# Patient Record
Sex: Male | Born: 1975 | Race: White | Hispanic: No | Marital: Married | Smoking: Never smoker
Health system: Southern US, Community
[De-identification: ages and names within clinical notes are randomized; demographics above are authoritative.]

## PROBLEM LIST (undated history)

## (undated) DIAGNOSIS — R972 Elevated prostate specific antigen [PSA]: Secondary | ICD-10-CM

## (undated) DIAGNOSIS — M5030 Other cervical disc degeneration, unspecified cervical region: Secondary | ICD-10-CM

## (undated) DIAGNOSIS — E663 Overweight: Secondary | ICD-10-CM

## (undated) DIAGNOSIS — G5692 Unspecified mononeuropathy of left upper limb: Secondary | ICD-10-CM

## (undated) DIAGNOSIS — E785 Hyperlipidemia, unspecified: Secondary | ICD-10-CM

## (undated) HISTORY — DX: Overweight: E66.3

## (undated) HISTORY — DX: Other cervical disc degeneration, unspecified cervical region: M50.30

## (undated) HISTORY — DX: Unspecified mononeuropathy of left upper limb: G56.92

## (undated) HISTORY — DX: Elevated prostate specific antigen (PSA): R97.20

## (undated) HISTORY — DX: Hyperlipidemia, unspecified: E78.5

---

## 2006-05-29 ENCOUNTER — Ambulatory Visit: Payer: Self-pay | Admitting: General Practice

## 2006-06-07 ENCOUNTER — Ambulatory Visit: Payer: Self-pay | Admitting: General Surgery

## 2007-02-13 ENCOUNTER — Ambulatory Visit (HOSPITAL_COMMUNITY): Admission: RE | Admit: 2007-02-13 | Discharge: 2007-02-14 | Payer: Self-pay | Admitting: Orthopaedic Surgery

## 2008-10-10 IMAGING — US ABDOMEN ULTRASOUND
1 series · 17 of 25 positions shown · non-contrast
Comparison: none

REASON FOR EXAM: RUQ Pain Fax Results to [facility] with [REDACTED]
[id number]
COMMENTS:

[Series 1: abdomen ultrasound · 17 of 54 slices shown]
[im 1/54]
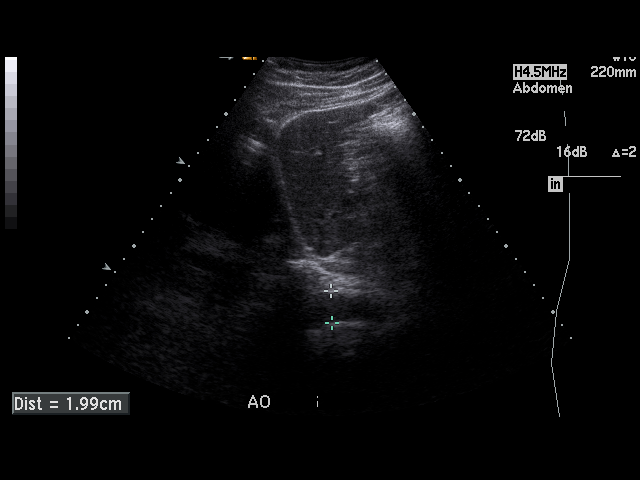
[im 5/54]
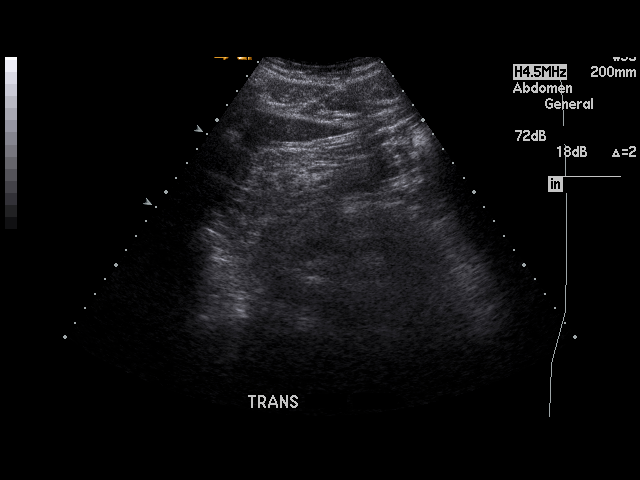
[im 7/54]
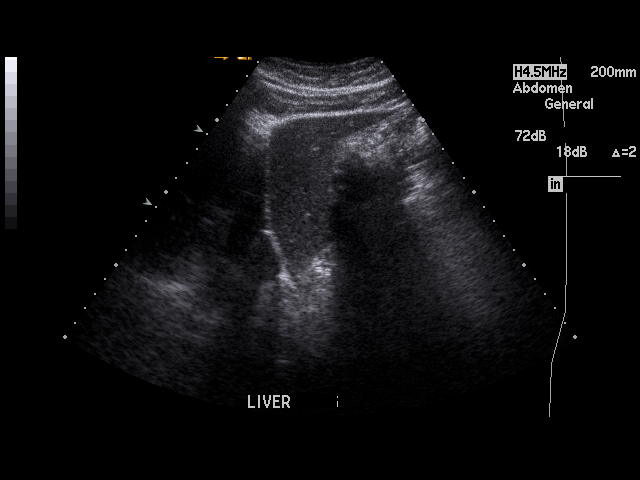
[im 12/54]
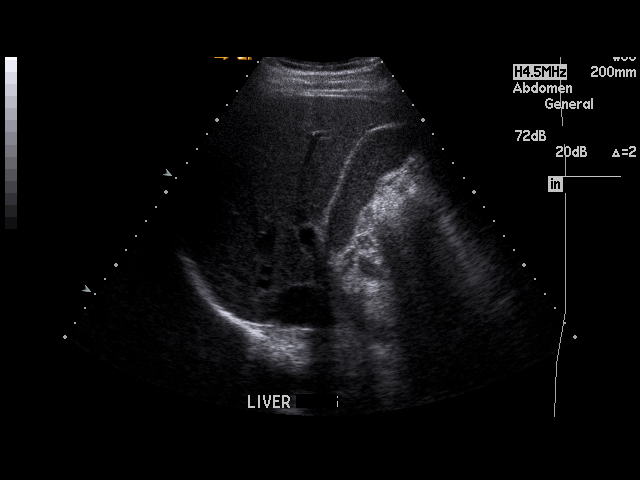
[im 14/54]
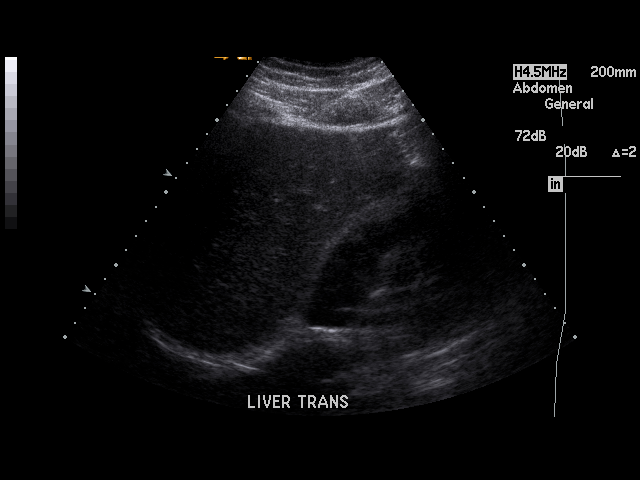
[im 18/54]
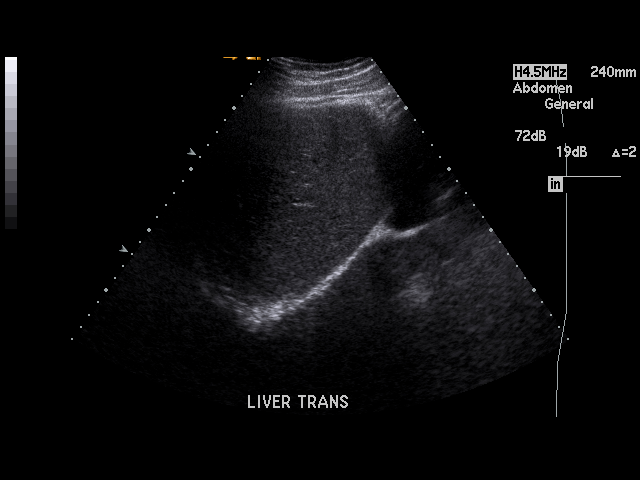
[im 20/54]
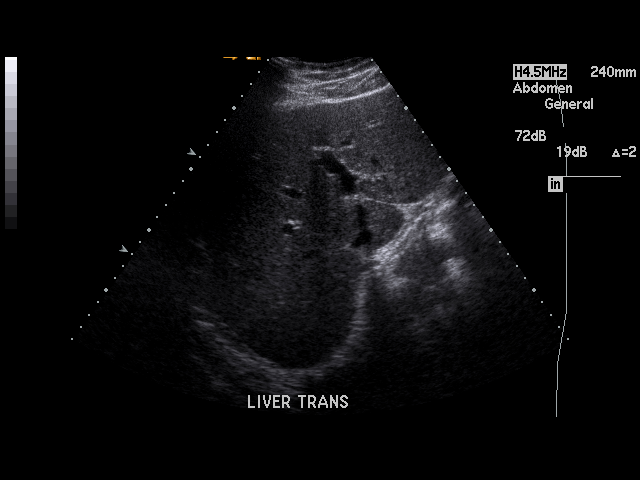
[im 25/54]
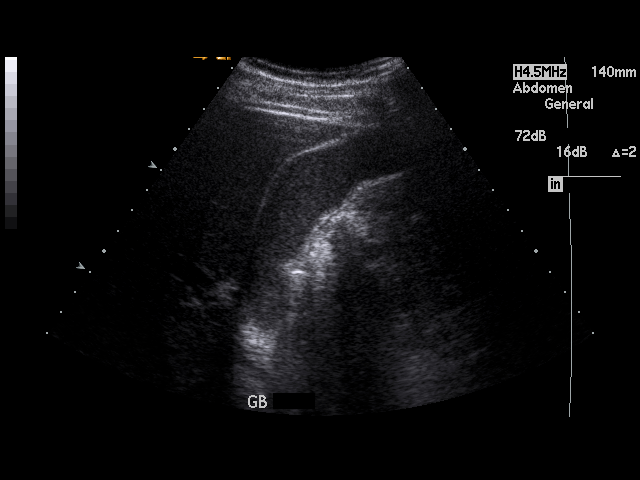
[im 27/54]
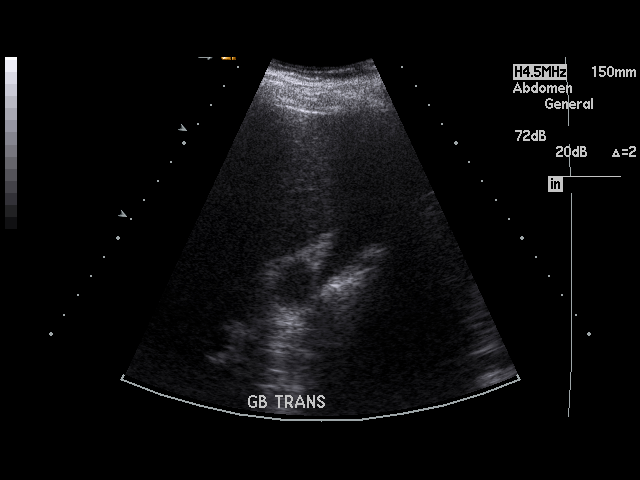
[im 29/54]
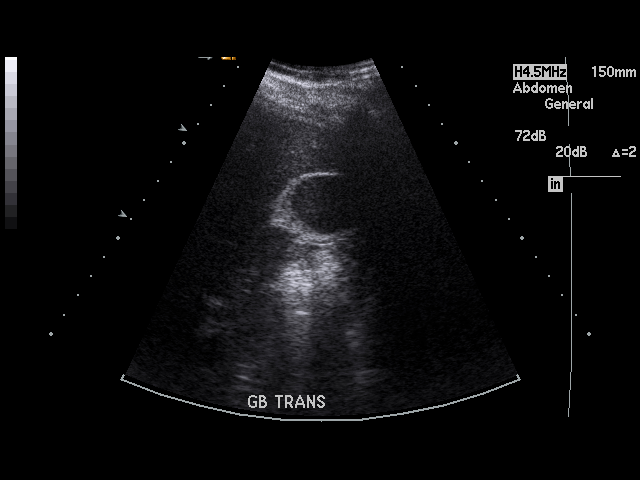
[im 34/54]
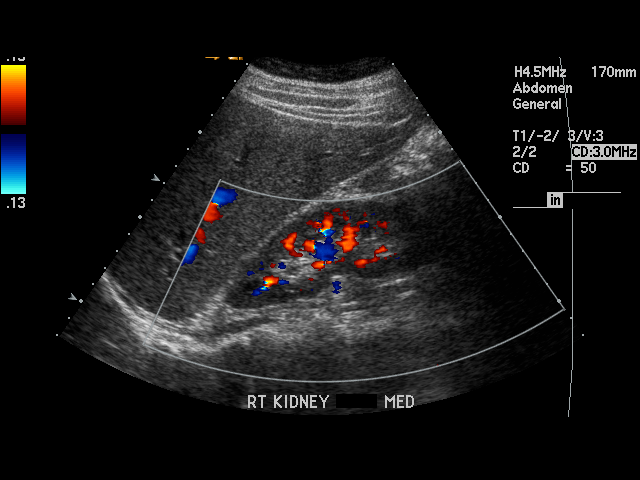
[im 36/54]
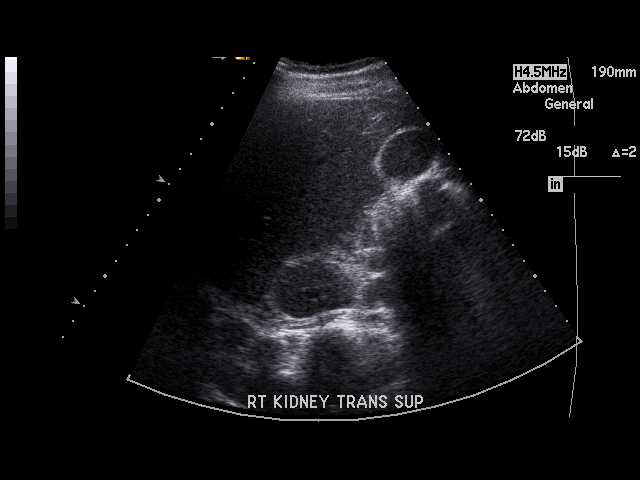
[im 40/54]
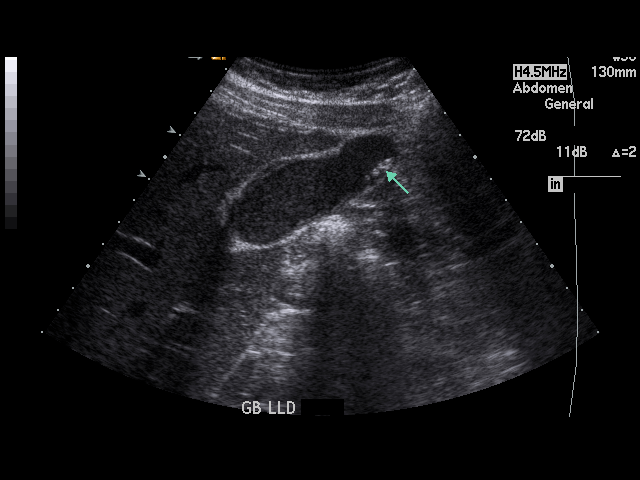
[im 42/54]
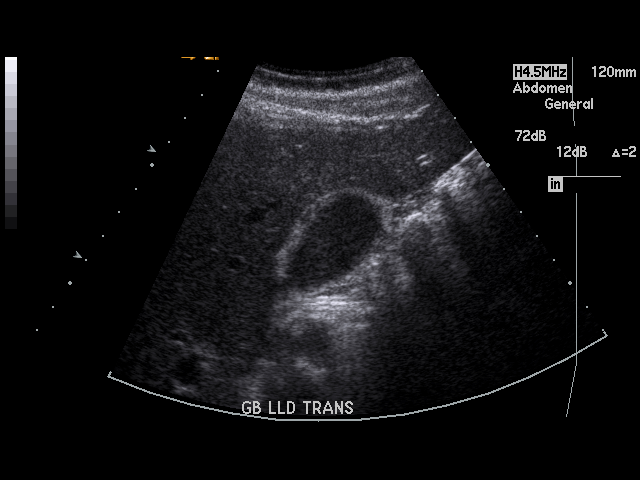
[im 47/54]
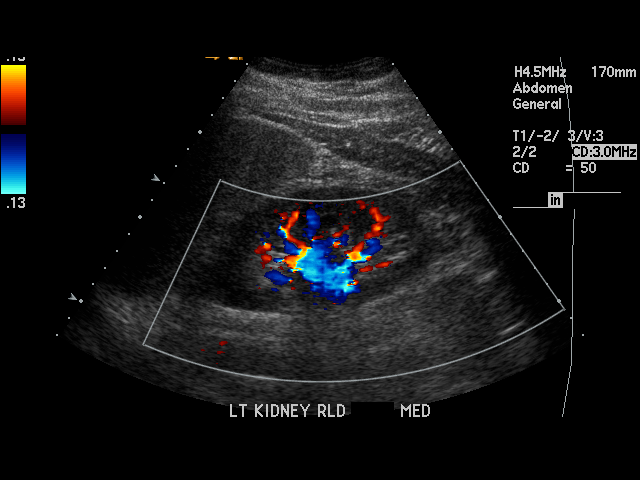
[im 49/54]
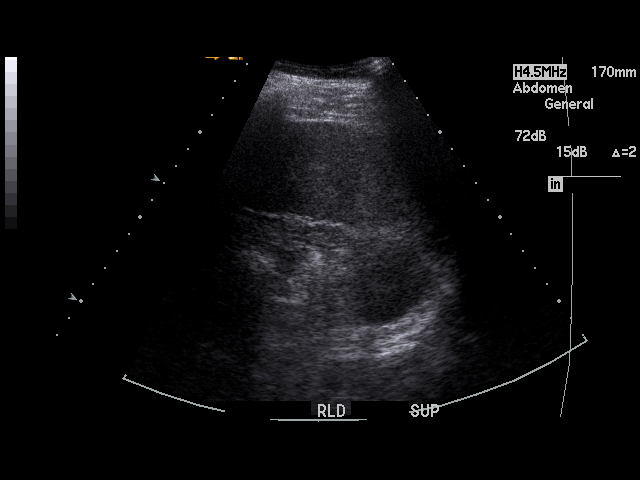
[im 54/54]
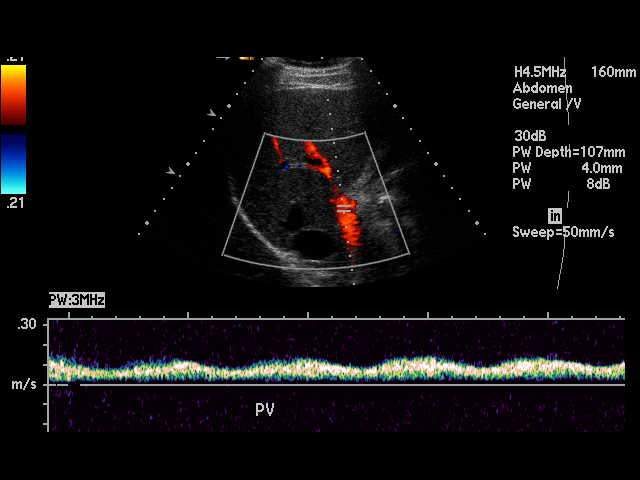

[17 of 25 positions shown; findings below may reference images not displayed]

PROCEDURE:     US  - US ABDOMEN GENERAL SURVEY  - May 29, 2006  [DATE]

RESULT:     The liver, spleen and abdominal aorta are normal in appearance.
The pancreas is not visualized adequately for evaluation on this exam. There
are noted echodensities in the gallbladder which appear to faintly shadow
and are suspicious for gallstones. There is no thickening of the gallbladder
wall. The common bile duct measures 3.7 mm in diameter which is within
normal limits. The kidneys show no hydronephrosis. There is no ascites.
IMPRESSION: 1.     Cholelithiasis.

## 2010-08-16 NOTE — Op Note (Signed)
NAME:  David Weber, David Weber NO.:  [id number]   MEDICAL RECORD NO.:  [id number]          PATIENT TYPE:  OIB   LOCATION:  5001                         FACILITY:  [facility]   PHYSICIAN:  Mark C. Ophelia Charter, M.D.    DATE OF BIRTH:  10/06/1975   DATE OF PROCEDURE:  02/13/2007  DATE OF DISCHARGE:  02/14/2007                               OPERATIVE REPORT   PREOPERATIVE DIAGNOSIS:  Left arm knife laceration with laceration to  ring finger flexor digitorum sublimis and flexor digitorum profundus  tendons (2 tendons), partial laceration left median nerve.   PROCEDURE:  Left hand exploration of palm knife laceration with  microscopic repair of median nerve laceration and repair of ring finger  flexor tendons, flexor digitorum profundus and sublimis (2 tendons).  Microscope-assisted nerve repair.   SURGEON:  Annell Greening, M.D.   TOURNIQUET TIME:  50 minutes total.   This 35 year old male was referred from up in [place] and actually  lives [place].  Injured his hand with a knife when  he was working on child's toy trying to open it to repair it.  He  accidentally stabbed his left palm, and he has a IT sales professional.  Exam  demonstrated laceration of both flexor tendons of the ring finger.  He  was unable flex.  He had no sensation to the long finger on either side  and some decreased sensation to the radial side of the ring finger.  Small finger, index and thumb sensation were intact.   PROCEDURE:  After induction of anesthesia, after patient obtained  informed consent, standard prepping and draping and preoperative  antibiotics.  Time-out procedure was confirmed.  Usual extremity sheets,  drapes were applied with hand table.  Left hand was used.  The patient  had a stab incision at the base of the thenar eminence about 1 cm from  the thenar skin crease.  The laceration was transverse and slightly  oblique.  A Brunner incision zigzag was used and extended proximally,  and the carpal canal was opened.  Median nerve was identified proximally  and followed distally, following along the ulnar aspect.  Distally, the  common digital nerves were identified and followed proximally, after the  palmar fascia was opened, subcutaneous bleeders were controlled with  bipolar cautery.  There was laceration of both tendons to the ring  finger that were identified, isolated and 4-0 Ethibond sutures were used  for repair followed by additional running small suture over the volar  aspect.  The laceration was at the level of the distal aspect of the  carpal canal, and with full extension, the repair did not reach the A1  pulley.  Median nerve showed a partial laceration with epineurium being  divided, and an bundle of the median nerve was identified that was  lacerated.  There was small partial damage to the motor branch which  looked like about 1/6 of the motor branch, and the remaining portion was  intact.  Operative microscope was draped and brought in using  microdissection techniques, and a total of 7 or 8 epineural sutures of 8-  0 nylon simple interrupted, the nerve was repaired with the proper  orientation of the bundle.  The branch to the index finger was not  lacerated and was intact.  Wound was irrigated.  Careful inspection of  all of the flexor tendons flexing the DIP as well as sublimis to make  sure there are no partial laceration to the other tendons, and none were  visualized.  After repeat irrigation, tourniquet was deflated.  Skin was  closed with nylon sutures.  The patient was placed in a flexor tendon  dressing  with the wrist flexed, MCPs flexed, PIPs flexed with fluffs in palm,  tweeners between the fingers, and a dorsal blocking splint to take  pressure off the nerve repair and tendon repair.  Marcaine was added to  the skin.  The patient tolerated procedure well and was transferred to  recovery in stable condition.      Mark C. Ophelia Charter, M.D.   Electronically Signed     MCY/MEDQ  D:  02/27/2007  T:  02/27/2007  Job:  [id number]

## 2011-01-10 LAB — BASIC METABOLIC PANEL
BUN: 9
CO2: 26
Calcium: 9.6
Creatinine, Ser: 0.93
GFR calc non Af Amer: >60
Glucose, Bld: 97
Sodium: 138

## 2011-01-10 LAB — CBC
Hemoglobin: 16.1
MCHC: 34.7
Platelets: 290
RDW: 12.7

## 2016-03-20 LAB — LIPID PANEL
Cholesterol: 235 — AB (ref 0–200)
HDL: 43 (ref 35–70)
LDL Cholesterol: 162
Triglycerides: 152 (ref 40–160)

## 2016-03-20 LAB — PSA: PSA: 0.5

## 2017-03-06 LAB — LIPID PANEL
Cholesterol: 209 — AB (ref 0–200)
HDL: 37 (ref 35–70)
LDL Cholesterol: 130
Triglycerides: 208 — AB (ref 40–160)

## 2017-03-06 LAB — PSA: PSA: 0.5

## 2018-03-07 LAB — LIPID PANEL
Cholesterol: 243 — AB (ref 0–200)
HDL: 45 (ref 35–70)
LDL Cholesterol: 167
Triglycerides: 157 (ref 40–160)

## 2018-03-07 LAB — PSA: PSA: 0.4

## 2018-09-30 ENCOUNTER — Other Ambulatory Visit: Payer: Self-pay

## 2018-09-30 DIAGNOSIS — E7800 Pure hypercholesterolemia, unspecified: Secondary | ICD-10-CM

## 2018-10-01 LAB — LIPID PANEL
Chol/HDL Ratio: 5.1 ratio — ABNORMAL HIGH (ref 0.0–5.0)
Cholesterol, Total: 220 mg/dL — ABNORMAL HIGH (ref 100–199)
HDL: 43 mg/dL (ref >39)
LDL Calculated: 156 mg/dL — ABNORMAL HIGH (ref 0–99)
Triglycerides: 106 mg/dL (ref 0–149)
VLDL Cholesterol Cal: 21 mg/dL (ref 5–40)

## 2018-10-02 LAB — HEPATITIS B SURFACE ANTIBODY,QUALITATIVE: Hep B S Ab: REACTIVE

## 2018-10-03 ENCOUNTER — Encounter: Payer: Self-pay | Admitting: Internal Medicine

## 2018-10-03 ENCOUNTER — Other Ambulatory Visit: Payer: Self-pay

## 2018-10-03 ENCOUNTER — Ambulatory Visit: Payer: Self-pay | Admitting: Internal Medicine

## 2018-10-03 VITALS — BP 123/94 | HR 64 | Temp 96.8°F | Resp 14 | Ht 69.5 in | Wt 213.0 lb

## 2018-10-03 DIAGNOSIS — R030 Elevated blood-pressure reading, without diagnosis of hypertension: Secondary | ICD-10-CM

## 2018-10-03 DIAGNOSIS — E785 Hyperlipidemia, unspecified: Secondary | ICD-10-CM | POA: Insufficient documentation

## 2018-10-03 DIAGNOSIS — F17200 Nicotine dependence, unspecified, uncomplicated: Secondary | ICD-10-CM

## 2018-10-03 DIAGNOSIS — E663 Overweight: Secondary | ICD-10-CM

## 2018-10-03 DIAGNOSIS — E7849 Other hyperlipidemia: Secondary | ICD-10-CM

## 2018-10-03 NOTE — Patient Instructions (Addendum)
Please check Blood Pressures a few times each week and record and bring back with you for your follow-up visit.  Try to lessen your chewing tobacco use.

## 2018-10-03 NOTE — Progress Notes (Signed)
S - Patient presents as lipids a concern in Dec, and a f/u check rec'ed in 6 months. He has tried to eat less red meat and do better with diet since, and hasn't been as good in the last 3+ months Less active with no regular exercise noted recent past as well No h/o meds need for BP control, was higher at one point in the past noted and improved with lifestyle changes Has been feeling well with no specificcomplaints No CP, SOB, palpitations, HA's, vision changes  + tob use, chew, about a can a day  No current outpatient medications on file prior to visit.   No current facility-administered medications on file prior to visit.      No Known Allergies  FH -Dad with HTN, also DM  O - NAD, masked, overweight  BP (!) 123/94 (BP Location: Right Arm, Patient Position: Sitting, Cuff Size: Large)   Pulse 64   Temp (!) 96.8 F (36 C) (Oral)   Resp 14   Ht 5' 9.5" (1.765 m)   Wt 213 lb (96.6 kg)   SpO2 97%   BMI 31.00 kg/m    Weight down 9 pounds from Dec Recheck BP - 144/103 on right, 130/98 on left with machine, large cuff  HEENT - sclera anicteric, PERRL, EOMI,  Neck - carotids 2+ and = with no bruits,  Car - RRR without m/g/r Pulm - CTA Ext - no LE edema Neuro - Affect not flat, approp with conversation  Grossly nonfocal  Labs reviewed - chol 220 and LDL 156 (243 and 167 in Dec), Ratio was 5.1  A/P - 1. Hyperlipidemia  Educated on the role of medications, risk/benefits and a statin would be the med rec'ed to help presently, noted with other risk factors, the indications for this increases and he wanted to wait until Dec and try a little harder with diet/exercise increase before starting a medicine to help. Not opposed. Importance of diet modifications emphasized, limiting saturated fats and reducing calories from fats helpful Importance of some regular aerobic exercise encouraged Above to help with weight control/weight loss also very important   2. HTN concern - concern not  as well controlled at present   Discussed goals for good control of BP  Importance of healthy diet and regular aerobic exercise and weight control noted Trying to get outside BP checks periodically and keep a log discussed and he noted he could and will bring on f/u visit to review Noted BP meds that would start if remains elevated and very well tolerated and importance of doing so if needed to keep BP controlled  3. Tob use - chew  Strongly encouraged to decrease use at present with hopes of stopping over time. Noted likely affecting BP's, also oral CA risk concern noted.   4. Overweight  Above measures with diet and exercise increase to help with further weight loss  F/u in 4 weeks at the latest, sooner prn

## 2018-10-31 ENCOUNTER — Ambulatory Visit: Payer: Self-pay | Admitting: Internal Medicine

## 2019-01-01 ENCOUNTER — Ambulatory Visit: Payer: Self-pay

## 2019-01-01 DIAGNOSIS — Z23 Encounter for immunization: Secondary | ICD-10-CM

## 2019-02-26 ENCOUNTER — Other Ambulatory Visit: Payer: Self-pay

## 2019-02-26 ENCOUNTER — Encounter: Payer: Self-pay | Admitting: Registered Nurse

## 2019-02-26 ENCOUNTER — Telehealth: Payer: Self-pay

## 2019-02-26 ENCOUNTER — Ambulatory Visit: Payer: Self-pay | Admitting: Registered Nurse

## 2019-02-26 DIAGNOSIS — Z0000 Encounter for general adult medical examination without abnormal findings: Secondary | ICD-10-CM

## 2019-02-26 DIAGNOSIS — R9431 Abnormal electrocardiogram [ECG] [EKG]: Secondary | ICD-10-CM

## 2019-02-26 LAB — POCT URINALYSIS DIPSTICK
Bilirubin, UA: NEGATIVE
Blood, UA: NEGATIVE
Glucose, UA: NEGATIVE
Ketones, UA: NEGATIVE
Leukocytes, UA: NEGATIVE
Nitrite, UA: NEGATIVE
Protein, UA: NEGATIVE
Spec Grav, UA: 1.015 (ref 1.010–1.025)
Urobilinogen, UA: 0.2 E.U./dL
pH, UA: 6 (ref 5.0–8.0)

## 2019-02-26 NOTE — Telephone Encounter (Signed)
Contacted [facility] at [phone] & made a Cardiology Referral as requested by Gerarda Fraction, NP-C (Interim Provider) for 03/10/2019 at 9:20 am with Dr. Kate Sable.  Myrle Sheng with appointment information and location of office. Verbalized understanding.  AMD

## 2019-02-26 NOTE — Addendum Note (Signed)
Addended by: Gerarda Fraction A on: 02/26/2019 06:14 PM   Modules accepted: Orders, Level of Service

## 2019-02-26 NOTE — Patient Instructions (Signed)
 Right Bundle Branch Block  Right bundle branch block (RBBB) is a problem with the way that electrical impulses pass through the heart (electrical conduction abnormality). The heart depends on an electrical pulse to beat normally. The electrical signal for a heartbeat starts in the upper chambers of the heart (atria) and then travels to the two lower chambers (left and rightventricles). An RBBB is a partial or complete block of the pathway that carries the signal to the right ventricle. If you have RBBB, the right side of your heart beats a little more slowly than the left side. RBBB may be a warning of heart disease or a lung problem. What are the causes? This condition may be caused by:  Heart attack (myocardial infarction).  Being born with a heart defect (congenital heart disease).  A blood clot that flows into the lung (pulmonary embolism).  Infection of heart muscle (myocarditis).  High blood pressure (hypertension). In some cases, the cause may not be known. What increases the risk? The following factors may make you more likely to develop this condition:  Being male.  Being 50 years of age or older.  Having heart disease.  Having had a heart attack or heart surgery. What are the signs or symptoms? This condition does not typically cause symptoms. How is this diagnosed? This condition may be diagnosed based on an electrocardiogram (ECG). It is often diagnosed when an ECG is done as part of a routine physical. You may also have imaging tests to find out more about your condition. These may include:  Chest X-rays.  Echocardiogram. How is this treated? Treatment may not be needed for this condition if you do not have symptoms or any other heart problems. However, you may need to see your health care provider more often because RBBB can be a warning sign of future heart or lung problems. You may need treatment for another condition that may be causing RBBB. If other forms  of heart block are present and you are having symptoms, a pacemaker is sometimes necessary. Follow these instructions at home:  Follow instructions from your health care provider about eating or drinking restrictions.  Take over-the-counter and prescription medicines only as told by your health care provider.  Return to your normal activities as told by your health care provider. Ask your health care provider what activities are safe for you.  Follow a heart-healthy diet and maintain a healthy weight. Work with a diet and nutrition specialist (dietitian) to create an eating plan that is best for you.  Get regular exercise as told by your health care provider.  Do not use any products that contain nicotine or tobacco, such as cigarettes and e-cigarettes. If you need help quitting, ask your health care provider.  Keep all follow-up visits as told by your health care provider. This is important. Contact a health care provider if:  You are light-headed or you faint. Get help right away if:  You have chest pain.  You have difficulty breathing. These symptoms may represent a serious problem that is an emergency. Do not wait to see if the symptoms will go away. Get medical help right away. Call your local emergency services (911 in the [place] Do not drive yourself to the hospital.  Summary  For the heart to beat normally, an electrical signal must travel to the heart's lower right chamber (right ventricle). Right bundle branch block (RBBB) is a partial or complete block of the pathway that carries that signal.  This   condition does not typically cause symptoms.  Treatment may not be needed for RBBB if you do not have symptoms or any other heart problems.  You may need to see your health care provider more often because RBBB can be a warning sign of future heart or lung problems. This information is not intended to replace advice given to you by your health care provider. Make sure you  discuss any questions you have with your health care provider. Document Released: 08/31/2016 Document Revised: 03/02/2017 Document Reviewed: 08/31/2016 Elsevier Patient Education  2020 Elsevier Inc.  

## 2019-02-26 NOTE — Progress Notes (Signed)
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  Per paper chart review new finding.  Referred to cardiology for further evaluation 03/10/2019 at [facility].  Patient given exitcare handout on Right Bundle Branch Block.  Discussed keep caffeine intake under 16oz per day; do not start any new exercise routines, avoid dehydration/skippping meals  A&Ox3 PERRL BBS CTA RRR skin warm dry and pink abd SND gait sure and steady hallway wearing mask due to covid 19 pandemic on/off exam table without difficulty.  No cough observed spoke full sentences without difficulty.  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  Patient to notify clinic staff if any new symptoms as discussed above.  Exec panel fasting lab results pending drawn today.  Patient verbalized understanding information/instructions, agreed with plan of care and had no further questions at this time.

## 2019-02-27 LAB — CMP12+LP+TP+TSH+6AC+PSA+CBC…
ALT: 35 IU/L (ref 0–44)
AST: 21 IU/L (ref 0–40)
Albumin: 4.9 g/dL (ref 4.0–5.0)
Basophils Absolute: 0.1 10*3/uL (ref 0.0–0.2)
Basos: 2 %
Bilirubin Total: 0.6 mg/dL (ref 0.0–1.2)
Calcium: 9.7 mg/dL (ref 8.7–10.2)
Chol/HDL Ratio: 5 ratio (ref 0.0–5.0)
Cholesterol, Total: 231 mg/dL — ABNORMAL HIGH (ref 100–199)
Eos: 4 %
Free Thyroxine Index: 2.1 (ref 1.2–4.9)
GFR calc Af Amer: 116 mL/min/{1.73_m2} (ref >59)
GFR calc non Af Amer: 100 mL/min/{1.73_m2} (ref >59)
GGT: 37 IU/L (ref 0–65)
Globulin, Total: 1.9 g/dL (ref 1.5–4.5)
Glucose: 90 mg/dL (ref 65–99)
Hemoglobin: 15.9 g/dL (ref 13.0–17.7)
Immature Granulocytes: 0 %
Iron: 111 ug/dL (ref 38–169)
LDH: 160 IU/L (ref 121–224)
Lymphs: 30 %
MCHC: 35.7 g/dL (ref 31.5–35.7)
Monocytes: 9 %
Neutrophils Absolute: 2.9 10*3/uL (ref 1.4–7.0)
Neutrophils: 55 %
Phosphorus: 2.8 mg/dL (ref 2.8–4.1)
Sodium: 139 mmol/L (ref 134–144)
T4, Total: 8 ug/dL (ref 4.5–12.0)
TSH: 3.88 u[IU]/mL (ref 0.450–4.500)
Total Protein: 6.8 g/dL (ref 6.0–8.5)
Triglycerides: 164 mg/dL — ABNORMAL HIGH (ref 0–149)
Uric Acid: 7.3 mg/dL (ref 3.7–8.6)
VLDL Cholesterol Cal: 30 mg/dL (ref 5–40)

## 2019-02-27 LAB — CMP12+LP+TP+TSH+6AC+PSA+CBC?
Albumin/Globulin Ratio: 2.6 — ABNORMAL HIGH (ref 1.2–2.2)
Alkaline Phosphatase: 86 IU/L (ref 39–117)
BUN/Creatinine Ratio: 11 (ref 9–20)
BUN: 10 mg/dL (ref 6–24)
Chloride: 103 mmol/L (ref 96–106)
Creatinine, Ser: 0.94 mg/dL (ref 0.76–1.27)
EOS (ABSOLUTE): 0.2 10*3/uL (ref 0.0–0.4)
Estimated CHD Risk: 1 times avg. (ref 0.0–1.0)
HDL: 46 mg/dL (ref >39)
Hematocrit: 44.6 % (ref 37.5–51.0)
Immature Grans (Abs): 0 10*3/uL (ref 0.0–0.1)
LDL Chol Calc (NIH): 155 mg/dL — ABNORMAL HIGH (ref 0–99)
Lymphocytes Absolute: 1.6 10*3/uL (ref 0.7–3.1)
MCH: 32 pg (ref 26.6–33.0)
MCV: 90 fL (ref 79–97)
Monocytes Absolute: 0.5 10*3/uL (ref 0.1–0.9)
Platelets: 287 10*3/uL (ref 150–450)
Potassium: 4.6 mmol/L (ref 3.5–5.2)
Prostate Specific Ag, Serum: 0.4 ng/mL (ref 0.0–4.0)
RBC: 4.97 x10E6/uL (ref 4.14–5.80)
RDW: 12.4 % (ref 11.6–15.4)
T3 Uptake Ratio: 26 % (ref 24–39)
WBC: 5.3 10*3/uL (ref 3.4–10.8)

## 2019-02-28 NOTE — Telephone Encounter (Signed)
noted 

## 2019-03-03 ENCOUNTER — Ambulatory Visit: Payer: Self-pay | Admitting: Occupational Medicine

## 2019-03-03 ENCOUNTER — Other Ambulatory Visit: Payer: Self-pay

## 2019-03-03 VITALS — BP 122/86 | HR 71 | Temp 98.6°F | Resp 16 | Ht 70.0 in | Wt 221.0 lb

## 2019-03-03 DIAGNOSIS — Z0000 Encounter for general adult medical examination without abnormal findings: Secondary | ICD-10-CM

## 2019-03-03 NOTE — Progress Notes (Signed)
Patient ID: David Weber DOB: 04/28/1975 AGE: 43 y.o. MRN: [id number]   PCP: No primary care provider on file.   Chief Complaint:  Chief Complaint  Patient presents with  . Annual Exam    Firefighter's Physical  . Covid Screening    Traveled [place] [facility] [place] past month - No symptoms     Subjective:    HPI:  David Clasaul B Cromie is a 43 y.o. male presents for evaluation  Chief Complaint  Patient presents with  . Annual Exam    Firefighter's Physical  . 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  Patient last seen at clinic on 10/03/2018 by Dr. Welford Rochelifford Hendrickson. Regular seen for elevated BP, hyperlipidemia, tobacco dependence and weight. Patient not on any prescription medications regularly. Surgical history of hand surgery from an injury (2007) and cholecystectomy (2008) with no sequela.  Blood work and UA performed on 02/26/2019 in preparation for today's physical. Cholesterol elevated at 231; triglycerides 164 and LDL 155. Total cholesterol and triglycerides 220 and 106 respectively, when last taken 5 months ago. CMP, thyroid panel, PSA, and CBC otherwise normal. UA with no abnormality.  EKG revealed RBBB; [facility] on March 10, 2019. Asymptomatic. No personal cardiac history. No family cardiac history.  Audiogram performed. No standard shift.  Patient reports recently becoming serious about healthy lifestyle changes. Has changed his diet; healthier alternatives including more vegetables, less red meat, and less sugar. Has started an exercise program; walking daily and hopes to begin weightlifting at the gym. Currently chews tobacco; plans on being serious about working towards quitting.  Patient with no complaints today.  A limited review of symptoms was performed, pertinent positives and negatives as mentioned in  HPI.  The following portions of the patient's history were reviewed and updated as appropriate: allergies, current medications and past medical history.  Patient Active Problem List   Diagnosis Date Noted  . Hyperlipidemia 10/03/2018    No Known Allergies  No current outpatient medications on file prior to visit.   No current facility-administered medications on file prior to visit.        Objective:   Vitals:   03/03/19 1449  BP: 122/86  Pulse: 71  Resp: 16  Temp: 98.6 F (37 C)  SpO2: 98%     Wt Readings from Last 3 Encounters:  03/03/19 221 lb (100.2 kg)  10/03/18 213 lb (96.6 kg)  03/07/18 223 lb (101.2 kg)    Physical Exam:   General Appearance:  Patient sitting comfortably on examination table. Conversational. Peri JeffersonGood self-historian. In no acute distress. Afebrile.   Head:  Normocephalic, without obvious abnormality, atraumatic  Eyes:  PERRL, conjunctiva/corneas clear, EOM's intact. No nystagmus.  Ears:  Left ear canal WNL. No erythema or edema. No open wound. No visible purulent drainage. No tenderness with palpation over left tragus or with manipulation of left auricle. No visible erythema or edema of left mastoid. No tenderness with palpation over left mastoid. Right ear canal WNL. No erythema or edema. No open wound. No visible purulent drainage. No tenderness with palpation over right tragus or with manipulation of right auricle. No visible erythema or edema of right mastoid. No tenderness with palpation over right mastoid. Left TM WNL. Good light reflex. Visible landmarks. No erythema. No injection. No bulging or retraction. No visible perforation. No serous effusion. No visible  purulent effusion. No tympanostomy tube. No scar tissue. Right TM WNL. Good light reflex. Visible landmarks. No erythema. No injection. No bulging or retraction. No visible perforation. No serous effusion. No visible purulent effusion. No tympanostomy tube. No scar tissue.  Nose: Nares  normal. Septum midline. No visible polyps. No discharge. Normal mucosa. No sinus tenderness with percussion/palpation.  Throat: Lips, mucosa, and tongue normal; teeth and gums normal. Throat reveals no erythema. No postnasal drip. No visible cobblestoning. Tonsils with no enlargement or exudate. Uvula midline with no edema or erythema.  Neck: Supple, symmetrical, trachea midline, no adenopathy. Full ROM without pain.  Lungs:   Clear to auscultation bilaterally, respirations unlabored. Good aeration. No rales, rhonchi, crackles or wheezing.  Heart:  Regular rate and rhythm, S1 and S2 normal, no murmur, rub, or gallop  Abdomen:   Normal to inspection. Normoactive bowel sounds. No tenderness with palpation. No guarding, rigidity or rebound tenderness. No palpable organomegaly.  Extremities: Extremities normal, atraumatic, no cyanosis or edema 5/5 upper and lower extremity strength. DTRs WNL. No issue with squat. Patient able to bend at waist without issue.  Pulses: 2+ and symmetric  Skin: Skin color, texture, turgor normal, no rashes or lesions  Lymph nodes: Cervical, supraclavicular, and axillary nodes normal  Neurologic: Normal. Finger to nose WNL. RAMs WNL. Negative Romberg. No pronator drift.    Assessment & Plan:    Exam findings, diagnosis etiology and medication use and indications reviewed with patient. Follow-Up and discharge instructions provided. No emergent/urgent issues found on exam.  Patient education was provided.   Patient verbalized understanding of information provided and agrees with plan of care (POC), all questions answered. The patient is advised to call or return to clinic if condition does not see an improvement in symptoms, or to seek the care of the closest emergency department if condition worsens with the below plan.   Orders Placed This Encounter  Procedures  . Vision screening  . Hearing screening    Order Specific Question:   Where should this test be performed?     Answer:   Other    Results for orders placed or performed in visit on 02/26/19  Male Executive Panel  Result Value Ref Range   Glucose 90 65 - 99 mg/dL   Uric Acid 7.3 3.7 - 8.6 mg/dL   BUN 10 6 - 24 mg/dL   Creatinine, Ser 0.94 0.76 - 1.27 mg/dL   GFR calc non Af Amer 100 >59 mL/min/1.73   GFR calc Af Amer 116 >59 mL/min/1.73   BUN/Creatinine Ratio 11 9 - 20   Sodium 139 134 - 144 mmol/L   Potassium 4.6 3.5 - 5.2 mmol/L   Chloride 103 96 - 106 mmol/L   Calcium 9.7 8.7 - 10.2 mg/dL   Phosphorus 2.8 2.8 - 4.1 mg/dL   Total Protein 6.8 6.0 - 8.5 g/dL   Albumin 4.9 4.0 - 5.0 g/dL   Globulin, Total 1.9 1.5 - 4.5 g/dL   Albumin/Globulin Ratio 2.6 (H) 1.2 - 2.2   Bilirubin Total 0.6 0.0 - 1.2 mg/dL   Alkaline Phosphatase 86 39 - 117 IU/L   LDH 160 121 - 224 IU/L   AST 21 0 - 40 IU/L   ALT 35 0 - 44 IU/L   GGT 37 0 - 65 IU/L   Iron 111 38 - 169 ug/dL   Cholesterol, Total 231 (H) 100 - 199 mg/dL   Triglycerides 164 (H) 0 - 149 mg/dL   HDL 46 >39 mg/dL  VLDL Cholesterol Cal 30 5 - 40 mg/dL   LDL Chol Calc (NIH) 428 (H) 0 - 99 mg/dL   Chol/HDL Ratio 5.0 0.0 - 5.0 ratio   Estimated CHD Risk 1.0 0.0 - 1.0 times avg.   TSH 3.880 0.450 - 4.500 uIU/mL   T4, Total 8.0 4.5 - 12.0 ug/dL   T3 Uptake Ratio 26 24 - 39 %   Free Thyroxine Index 2.1 1.2 - 4.9   Prostate Specific Ag, Serum 0.4 0.0 - 4.0 ng/mL   WBC 5.3 3.4 - 10.8 x10E3/uL   RBC 4.97 4.14 - 5.80 x10E6/uL   Hemoglobin 15.9 13.0 - 17.7 g/dL   Hematocrit 76.8 11.5 - 51.0 %   MCV 90 79 - 97 fL   MCH 32.0 26.6 - 33.0 pg   MCHC 35.7 31.5 - 35.7 g/dL   RDW 72.6 20.3 - 55.9 %   Platelets 287 150 - 450 x10E3/uL   Neutrophils 55 Not Estab. %   Lymphs 30 Not Estab. %   Monocytes 9 Not Estab. %   Eos 4 Not Estab. %   Basos 2 Not Estab. %   Neutrophils Absolute 2.9 1.4 - 7.0 x10E3/uL   Lymphocytes Absolute 1.6 0.7 - 3.1 x10E3/uL   Monocytes Absolute 0.5 0.1 - 0.9 x10E3/uL   EOS (ABSOLUTE) 0.2 0.0 - 0.4 x10E3/uL   Basophils  Absolute 0.1 0.0 - 0.2 x10E3/uL   Immature Granulocytes 0 Not Estab. %   Immature Grans (Abs) 0.0 0.0 - 0.1 x10E3/uL  POCT Urinalysis Dipstick (CPT 81002)  Result Value Ref Range   Color, UA Yellow    Clarity, UA Clear    Glucose, UA Negative Negative   Bilirubin, UA Negative    Ketones, UA Negative    Spec Grav, UA 1.015 1.010 - 1.025   Blood, UA Negative    pH, UA 6.0 5.0 - 8.0   Protein, UA Negative Negative   Urobilinogen, UA 0.2 0.2 or 1.0 E.U./dL   Nitrite, UA Negative    Leukocytes, UA Negative Negative   Appearance     Odor      1. Routine adult health maintenance  - Hearing screening - Vision screening  Patient meets NFPA 1582: Standard on Comprehensive Occupational Medical Programs for [employer].  Patient with new right bundle branch block. Asymptomatic. Has cardiology appointment on 12/7.  Discussed patient's elevated cholesterol. Diet and exercise recommendations.  Patient will need repeat physical in one year.   Janalyn Harder, MHS, PA-C Rulon Sera, MHS, PA-C Advanced Practice Provider [facility]  [phone] [place], [place] [address] (p): [phone] [email or web address]

## 2019-03-10 ENCOUNTER — Encounter: Payer: Self-pay | Admitting: Cardiology

## 2019-03-10 ENCOUNTER — Other Ambulatory Visit: Payer: Self-pay

## 2019-03-10 ENCOUNTER — Ambulatory Visit (INDEPENDENT_AMBULATORY_CARE_PROVIDER_SITE_OTHER): Payer: 59 | Admitting: Cardiology

## 2019-03-10 VITALS — BP 134/88 | HR 72 | Ht 69.0 in | Wt 210.0 lb

## 2019-03-10 DIAGNOSIS — I444 Left anterior fascicular block: Secondary | ICD-10-CM | POA: Diagnosis not present

## 2019-03-10 DIAGNOSIS — I4510 Unspecified right bundle-branch block: Secondary | ICD-10-CM

## 2019-03-10 NOTE — Patient Instructions (Signed)
Medication Instructions:  Your physician recommends that you continue on your current medications as directed. Please refer to the Current Medication list given to you today.  *If you need a refill on your cardiac medications before your next appointment, please call your pharmacy*  Lab Work: None ordered If you have labs (blood work) drawn today and your tests are completely normal, you will receive your results only by: . MyChart Message (if you have MyChart) OR . A paper copy in the mail If you have any lab test that is abnormal or we need to change your treatment, we will call you to review the results.  Testing/Procedures: None ordered  Follow-Up: At [facility], you and your health needs are our priority.  As part of our continuing mission to provide you with exceptional heart care, we have created designated Provider Care Teams.  These Care Teams include your primary Cardiologist (physician) and Advanced Practice Providers (APPs -  Physician Assistants and Nurse Practitioners) who all work together to provide you with the care you need, when you need it.  Your next appointment:   As needed   The format for your next appointment:   In Person  Provider:    You may see Brian Agbor-Etang, MD or one of the following Advanced Practice Providers on your designated Care Team:    Christopher Berge, NP  Ryan Dunn, PA-C  Jacquelyn Visser, PA-C   Other Instructions N/A  

## 2019-03-10 NOTE — Progress Notes (Signed)
Reviewed cardiology note no restrictions on activity continue exercise follow up prn if he experiences symptoms as currently asymptomatic.  Please ensure Dr Geoffry Paradise notified and if she is unable to review cardiology note in Epic to print for her as I am not as familiar with [place] regulations.

## 2019-03-10 NOTE — Progress Notes (Signed)
Cardiology Office Note:    Date:  03/10/2019   ID:  David Weber, DOB 10/03/1975, MRN [id number]  PCP:  System, Pcp Not In  Cardiologist:  Kate Sable, MD  Electrophysiologist:  None   Referring MD: Olen Cordial, NP   Chief Complaint  Patient presents with  . New patient    Referred by [facility] for RBBB. MEds reviewed vebally with paitent,    David Weber is a 43 y.o. male who is being seen today for the evaluation of abnormal EKG/right bundle branch block at the request of Betancourt, Aura Fey, NP.   History of Present Illness:    David Weber is a 43 y.o. male with a hx of hyperlipidemia, degenerative disc disease who presents due to an abnormal ECG.  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  EKG showed right bundle branch block with heart rate of 65 bpm.  Patient denies any symptoms of chest pain, dyspnea, palpitations, headaches, presyncope, dizziness, syncope.  He denies any history of cardiac disease.  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  He jogs pretty frequently with no restrictions.  Past Medical History:  Diagnosis Date  . DDD (degenerative disc disease), cervical   . Elevated lipids   . Elevated PSA   . Neuropathy of hand, left   . Overweight     Past Surgical History:  Procedure Laterality Date  . CHOLECYSTECTOMY  2007  . HAND SURGERY  2008    Current Medications: No outpatient medications have been marked as taking for the 03/10/19 encounter (Office Visit) with Kate Sable, MD.     Allergies:   Patient has no known allergies.   Social History   Socioeconomic History  . Marital status: Married    Spouse name: Not on file  . Number of children: Not on file  . Years of education: Not on file  . Highest education level: Not on file  Occupational History  . Not on file  Social  Needs  . Financial resource strain: Not on file  . Food insecurity    Worry: Not on file    Inability: Not on file  . Transportation needs    Medical: Not on file    Non-medical: Not on file  Tobacco Use  . Smoking status: Never Smoker  . Smokeless tobacco: Current User    Types: Chew  Substance and Sexual Activity  . Alcohol use: Yes    Alcohol/week: 4.0 standard drinks    Types: 4 Cans of beer per week  . Drug use: Never  . Sexual activity: Not on file  Lifestyle  . Physical activity    Days per week: Not on file    Minutes per session: Not on file  . Stress: Not on file  Relationships  . Social Herbalist on phone: Not on file    Gets together: Not on file    Attends religious service: Not on file    Active member of club or organization: Not on file    Attends meetings of clubs or organizations: Not on file    Relationship status: Not on file  Other Topics Concern  . Not on file  Social History Narrative  . Not on file     Family History: The patient's family history includes Arrhythmia in his father; Colon polyps in  his father and mother; Diabetes in his father; Melanoma in his mother.  ROS:   Please see the history of present illness.     All other systems reviewed and are negative.  EKGs/Labs/Other Studies Reviewed:    The following studies were reviewed today:   EKG:  EKG is  ordered today.  The ekg ordered today demonstrates normal sinus rhythm, right bundle branch block, left anterior fascicular block.  Recent Labs: 02/26/2019: ALT 35; BUN 10; Creatinine, Ser 0.94; Hemoglobin 15.9; Platelets 287; Potassium 4.6; Sodium 139; TSH 3.880  Recent Lipid Panel    Component Value Date/Time   CHOL 231 (H) 02/26/2019 0955   TRIG 164 (H) 02/26/2019 0955   HDL 46 02/26/2019 0955   CHOLHDL 5.0 02/26/2019 0955   LDLCALC 155 (H) 02/26/2019 0955    Physical Exam:    VS:  BP 134/88 (BP Location: Right Arm, Patient Position: Sitting, Cuff Size:  Normal)   Pulse 72   Ht 5\' 9"  (1.753 m)   Wt 210 lb (95.3 kg)   BMI 31.01 kg/m     Wt Readings from Last 3 Encounters:  03/10/19 210 lb (95.3 kg)  03/03/19 221 lb (100.2 kg)  10/03/18 213 lb (96.6 kg)     GEN:  Well nourished, well developed in no acute distress HEENT: Normal NECK: No JVD; No carotid bruits LYMPHATICS: No lymphadenopathy CARDIAC: RRR, no murmurs, rubs, gallops RESPIRATORY:  Clear to auscultation without rales, wheezing or rhonchi  ABDOMEN: Soft, non-tender, non-distended MUSCULOSKELETAL:  No edema; No deformity  SKIN: Warm and dry NEUROLOGIC:  Alert and oriented x 3 PSYCHIATRIC:  Normal affect   ASSESSMENT:   Patient with 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  Heart rate within normal limits.  He has no symptoms of dizziness, presyncope, or syncope.  No history of or clinical factors for infiltrative disease.  ASCVD 10-year risk is 2% from last lipid panel.  No indication for statins. 1. RBBB   2. LAFB (left anterior fascicular block)    PLAN:    In order of problems listed above:  1. Right bundle branch block and left anterior fascicular block.  No indication for pacemaker or further testing at this point.  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  He was encouraged to continue exercising  as he normally walks.  We will order as needed  Patient was educated on the electrical system of the heart, its relationship with muscle contraction.  Signs and symptoms of complete AV block illustrated and explained. Total encounter time more than 60 minutes  Greater than 50% was spent in counseling and coordination of care with the patient    Medication Adjustments/Labs and Tests Ordered: Current medicines are reviewed at length with the patient today.  Concerns regarding medicines are outlined above.  Orders Placed This Encounter  Procedures  . EKG 12-Lead   No orders of  the defined types were placed in this encounter.   Patient Instructions  Medication Instructions:  Your physician recommends that you continue on your current medications as directed. Please refer to the Current Medication list given to you today.  *If you need a refill on your cardiac medications before your next appointment, please call your pharmacy*  Lab Work: None ordered If you have labs (blood work) drawn today and your tests are completely normal, you will receive your results only by: Marland Kitchen. MyChart Message (if you have MyChart) OR . A  paper copy in the mail If you have any lab test that is abnormal or we need to change your treatment, we will call you to review the results.  Testing/Procedures: None ordered  Follow-Up: At [facility], you and your health needs are our priority.  As part of our continuing mission to provide you with exceptional heart care, we have created designated Provider Care Teams.  These Care Teams include your primary Cardiologist (physician) and Advanced Practice Providers (APPs -  Physician Assistants and Nurse Practitioners) who all work together to provide you with the care you need, when you need it.  Your next appointment:   As needed   The format for your next appointment:   In Person  Provider:    You may see Debbe Odea, MD or one of the following Advanced Practice Providers on your designated Care Team:    Nicolasa Ducking, NP  Eula Listen, PA-C  Marisue Ivan, PA-C   Other Instructions N/A     Signed, Debbe Odea, MD  03/10/2019 9:39 AM    [facility]

## 2019-03-13 NOTE — Telephone Encounter (Signed)
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

## 2019-03-14 NOTE — Telephone Encounter (Signed)
Cardiology notes printed & put on chart for Dr. Geoffry Paradise to review.  AMD

## 2019-03-14 NOTE — Telephone Encounter (Signed)
Noted.  Cardiology note printed for Dr Geoffry Paradise to review.

## 2019-03-21 ENCOUNTER — Encounter: Payer: Self-pay | Admitting: Occupational Medicine

## 2019-03-21 NOTE — Progress Notes (Signed)
Reviewed cardiology note.  OK for full duty. 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

## 2019-07-15 ENCOUNTER — Ambulatory Visit: Payer: 59

## 2019-07-16 ENCOUNTER — Other Ambulatory Visit: Payer: Self-pay

## 2019-07-16 ENCOUNTER — Ambulatory Visit: Payer: Self-pay | Admitting: Registered Nurse

## 2019-07-16 ENCOUNTER — Encounter: Payer: Self-pay | Admitting: Registered Nurse

## 2019-07-16 VITALS — BP 140/92 | HR 68 | Temp 98.2°F | Resp 12 | Ht 70.0 in | Wt 220.0 lb

## 2019-07-16 DIAGNOSIS — M79642 Pain in left hand: Secondary | ICD-10-CM

## 2019-07-16 DIAGNOSIS — R202 Paresthesia of skin: Secondary | ICD-10-CM

## 2019-07-16 DIAGNOSIS — R030 Elevated blood-pressure reading, without diagnosis of hypertension: Secondary | ICD-10-CM

## 2019-07-16 DIAGNOSIS — Z6831 Body mass index (BMI) 31.0-31.9, adult: Secondary | ICD-10-CM

## 2019-07-16 MED ORDER — IBUPROFEN 800 MG PO TABS: 800.0000 mg | ORAL_TABLET | Freq: Three times a day (TID) | ORAL | 0 refills | Status: DC

## 2019-07-16 NOTE — Patient Instructions (Addendum)
 Calorie Counting for Weight Loss Calories are units of energy. Your body needs a certain amount of calories from food to keep you going throughout the day. When you eat more calories than your body needs, your body stores the extra calories as fat. When you eat fewer calories than your body needs, your body burns fat to get the energy it needs. Calorie counting means keeping track of how many calories you eat and drink each day. Calorie counting can be helpful if you need to lose weight. If you make sure to eat fewer calories than your body needs, you should lose weight. Ask your health care provider what a healthy weight is for you. For calorie counting to work, you will need to eat the right number of calories in a day in order to lose a healthy amount of weight per week. A dietitian can help you determine how many calories you need in a day and will give you suggestions on how to reach your calorie goal.  A healthy amount of weight to lose per week is usually 1-2 lb (0.5-0.9 kg). This usually means that your daily calorie intake should be reduced by 500-750 calories.  Eating 1,200 - 1,500 calories per day can help most women lose weight.  Eating 1,500 - 1,800 calories per day can help most men lose weight. What is my plan? My goal is to have __________ calories per day. If I have this many calories per day, I should lose around __________ pounds per week. What do I need to know about calorie counting? In order to meet your daily calorie goal, you will need to:  Find out how many calories are in each food you would like to eat. Try to do this before you eat.  Decide how much of the food you plan to eat.  Write down what you ate and how many calories it had. Doing this is called keeping a food log. To successfully lose weight, it is important to balance calorie counting with a healthy lifestyle that includes regular activity. Aim for 150 minutes of moderate exercise (such as walking) or 75  minutes of vigorous exercise (such as running) each week. Where do I find calorie information?  The number of calories in a food can be found on a Nutrition Facts label. If a food does not have a Nutrition Facts label, try to look up the calories online or ask your dietitian for help. Remember that calories are listed per serving. If you choose to have more than one serving of a food, you will have to multiply the calories per serving by the amount of servings you plan to eat. For example, the label on a package of bread might say that a serving size is 1 slice and that there are 90 calories in a serving. If you eat 1 slice, you will have eaten 90 calories. If you eat 2 slices, you will have eaten 180 calories. How do I keep a food log? Immediately after each meal, record the following information in your food log:  What you ate. Don't forget to include toppings, sauces, and other extras on the food.  How much you ate. This can be measured in cups, ounces, or number of items.  How many calories each food and drink had.  The total number of calories in the meal. Keep your food log near you, such as in a small notebook in your pocket, or use a mobile app or website. Some programs will   calculate calories for you and show you how many calories you have left for the day to meet your goal. What are some calorie counting tips?   Use your calories on foods and drinks that will fill you up and not leave you hungry: ? Some examples of foods that fill you up are nuts and nut butters, vegetables, lean proteins, and high-fiber foods like whole grains. High-fiber foods are foods with more than 5 g fiber per serving. ? Drinks such as sodas, specialty coffee drinks, alcohol, and juices have a lot of calories, yet do not fill you up.  Eat nutritious foods and avoid empty calories. Empty calories are calories you get from foods or beverages that do not have many vitamins or protein, such as candy, sweets, and  soda. It is better to have a nutritious high-calorie food (such as an avocado) than a food with few nutrients (such as a bag of chips).  Know how many calories are in the foods you eat most often. This will help you calculate calorie counts faster.  Pay attention to calories in drinks. Low-calorie drinks include water and unsweetened drinks.  Pay attention to nutrition labels for "low fat" or "fat free" foods. These foods sometimes have the same amount of calories or more calories than the full fat versions. They also often have added sugar, starch, or salt, to make up for flavor that was removed with the fat.  Find a way of tracking calories that works for you. Get creative. Try different apps or programs if writing down calories does not work for you. What are some portion control tips?  Know how many calories are in a serving. This will help you know how many servings of a certain food you can have.  Use a measuring cup to measure serving sizes. You could also try weighing out portions on a kitchen scale. With time, you will be able to estimate serving sizes for some foods.  Take some time to put servings of different foods on your favorite plates, bowls, and cups so you know what a serving looks like.  Try not to eat straight from a bag or box. Doing this can lead to overeating. Put the amount you would like to eat in a cup or on a plate to make sure you are eating the right portion.  Use smaller plates, glasses, and bowls to prevent overeating.  Try not to multitask (for example, watch TV or use your computer) while eating. If it is time to eat, sit down at a table and enjoy your food. This will help you to know when you are full. It will also help you to be aware of what you are eating and how much you are eating. What are tips for following this plan? Reading food labels  Check the calorie count compared to the serving size. The serving size may be smaller than what you are used to  eating.  Check the source of the calories. Make sure the food you are eating is high in vitamins and protein and low in saturated and trans fats. Shopping  Read nutrition labels while you shop. This will help you make healthy decisions before you decide to purchase your food.  Make a grocery list and stick to it. Cooking  Try to cook your favorite foods in a healthier way. For example, try baking instead of frying.  Use low-fat dairy products. Meal planning  Use more fruits and vegetables. Half of your plate should be  fruits and vegetables.  Include lean proteins like poultry and fish. How do I count calories when eating out?  Ask for smaller portion sizes.  Consider sharing an entree and sides instead of getting your own entree.  If you get your own entree, eat only half. Ask for a box at the beginning of your meal and put the rest of your entree in it so you are not tempted to eat it.  If calories are listed on the menu, choose the lower calorie options.  Choose dishes that include vegetables, fruits, whole grains, low-fat dairy products, and lean protein.  Choose items that are boiled, broiled, grilled, or steamed. Stay away from items that are buttered, battered, fried, or served with cream sauce. Items labeled "crispy" are usually fried, unless stated otherwise.  Choose water, low-fat milk, unsweetened iced tea, or other drinks without added sugar. If you want an alcoholic beverage, choose a lower calorie option such as a glass of wine or light beer.  Ask for dressings, sauces, and syrups on the side. These are usually high in calories, so you should limit the amount you eat.  If you want a salad, choose a garden salad and ask for grilled meats. Avoid extra toppings like bacon, cheese, or fried items. Ask for the dressing on the side, or ask for olive oil and vinegar or lemon to use as dressing.  Estimate how many servings of a food you are given. For example, a serving of  cooked rice is  cup or about the size of half a baseball. Knowing serving sizes will help you be aware of how much food you are eating at restaurants. The list below tells you how big or small some common portion sizes are based on everyday objects: ? 1 oz--4 stacked dice. ? 3 oz--1 deck of cards. ? 1 tsp--1 die. ? 1 Tbsp-- a ping-pong ball. ? 2 Tbsp--1 ping-pong ball. ?  cup-- baseball. ? 1 cup--1 baseball. Summary  Calorie counting means keeping track of how many calories you eat and drink each day. If you eat fewer calories than your body needs, you should lose weight.  A healthy amount of weight to lose per week is usually 1-2 lb (0.5-0.9 kg). This usually means reducing your daily calorie intake by 500-750 calories.  The number of calories in a food can be found on a Nutrition Facts label. If a food does not have a Nutrition Facts label, try to look up the calories online or ask your dietitian for help.  Use your calories on foods and drinks that will fill you up, and not on foods and drinks that will leave you hungry.  Use smaller plates, glasses, and bowls to prevent overeating. This information is not intended to replace advice given to you by your health care provider. Make sure you discuss any questions you have with your health care provider. Document Revised: 12/07/2017 Document Reviewed: 02/18/2016 Elsevier Patient Education  [place].  Wrist and Forearm Exercises Ask your health care provider which exercises are safe for you. Do exercises exactly as told by your health care provider and adjust them as directed. It is normal to feel mild stretching, pulling, tightness, or discomfort as you do these exercises. Stop right away if you feel sudden pain or your pain gets worse. Do not begin these exercises until told by your health care provider. Range-of-motion exercises These exercises warm up your muscles and joints and improve the movement and flexibility of your  injured wrist and forearm. These exercises also help to relieve pain, numbness, and tingling. These exercises are done using the muscles in your injured wrist and forearm. Wrist flexion Bend your left / right elbow to a 90-degree angle (right angle) with your palm facing the floor. Bend your wrist so that your fingers point toward the floor (flexion). Hold this position for ______15____ seconds. Slowly return to the starting position. Repeat _____3_____ times. Complete this exercise _____3_____ times a day. Wrist extension Bend your left / right elbow to a 90-degree angle (right angle) with your palm facing the floor. Bend your wrist so that your fingers point toward the ceiling (extension). Hold this position for ___15_______ seconds. Slowly return to the starting position. Repeat ______3____ times. Complete this exercise _____3_____ times a day. Ulnar deviation Bend your left / right elbow to a 90-degree angle (right angle), and rest your forearm on a table with your palm facing down. Keeping your hand flat on the table, bend your left /right wrist toward your small finger (pinkie). This is ulnar deviation. Hold this position for ___15_______ seconds. Slowly return to the starting position. Repeat ____3______ times. Complete this exercise ____3______ times a day. Radial deviation Bend your left / right elbow to a 90-degree angle (right angle), and rest your forearm on a table with your palm facing down. Keeping your hand flat on the table, bend your left /right wrist toward your thumb. This is radial deviation. Hold this position for _____15_____ seconds. Slowly return to the starting position. Repeat ____3______ times. Complete this exercise _____3_____ times a day. Forearm rotation, supination  Sit with your left / right elbow bent to a 90-degree angle (right angle). Position your forearm so that the thumb is facing the ceiling (neutral position). Turn (rotate) your palm up toward the  ceiling (supination), stopping when you feel a gentle stretch. Hold this position for ____15_____ seconds. Slowly return to the starting position. Repeat ____3______ times. Complete this exercise ______3____ times a day. Forearm rotation, pronation  Sit with your left / right elbow bent to a 90-degree angle (right angle). Position your forearm so that the thumb is facing the ceiling (neutral position). Rotate your palm down toward the floor (pronation), stopping when you feel a gentle stretch. Hold this position for _____15_____ seconds. Slowly return to the starting position. Repeat ____3______ times. Complete this exercise ______3____ times a day. Stretching These exercises warm up your muscles and joints and improve the movement and flexibility of your injured wrist and forearm. These exercises also help to relieve pain, numbness, and tingling. These exercises are done using your healthy wrist and forearm to help stretch the muscles in your injured wrist and forearm. Wrist flexion  Extend your left / right arm in front of you, and turn your palm down toward the floor. If told by your health care provider, bend your left / right elbow to a 90-degree angle (right angle) at your side. Using your uninjured hand, gently press over the back of your left / right hand to bend your wrist and fingers toward the floor (flexion). Go as far as you can to feel a stretch without causing pain. Hold this position for ____15______ seconds. Slowly return to the starting position. Repeat ____3______ times. Complete this exercise _____3_____ times a day. Wrist extension  Extend your left / right arm in front of you and turn your palm up toward the ceiling. If told by your health care provider, bend your left / right elbow to a  90-degree angle (right angle) at your side. Using your uninjured hand, gently press over the palm of your left / right hand to bend your wrist and fingers toward the floor (extension).  Go as far as you can to feel a stretch without causing pain. Hold this position for _____15_____ seconds. Slowly return to the starting position. Repeat ____3______ times. Complete this exercise _____3_____ times a day. Forearm rotation, supination Sit with your left / right elbow bent to a 90-degree angle (right angle). Position your forearm so that the thumb is facing the ceiling (neutral position). Rotate your palm up toward the ceiling as far as you can on your own (supination). Then, use your uninjured hand to help turn your forearm more, stopping when you feel a gentle stretch. Hold this position for ___15_______ seconds. Slowly return to the starting position. Repeat __________ times. Complete this exercise __________ times a day. Forearm rotation, pronation Sit with your left / right elbow bent to a 90-degree angle (right angle). Position your forearm so that the thumb is facing the ceiling (neutral position). Rotate your palm down toward the floor as far as you can on your own (pronation). Then, use your uninjured hand to help turn your forearm more, stopping when you feel a gentle stretch. Hold this position for __________ seconds. Slowly return to the starting position. Repeat __________ times. Complete this exercise __________ times a day. Strengthening exercises These exercises build strength and endurance in your wrist and forearm. Endurance is the ability to use your muscles for a long time, even after they get tired. Wrist flexion  Sit with your left / right forearm supported on a table or other surface. Bend your elbow to a 90-degree angle (right angle), and rest your hand palm-up over the edge of the table. Hold a ____none______ weight in your left / right hand. Or, hold an exercise band or tube in both hands, keeping your hands at the same level and hip distance apart. There should be a slight tension in the exercise band or tube. Slowly curl your hand up toward the ceiling  (flexion). Hold this position for ____15______ seconds. Slowly lower your hand back to the starting position. Repeat ___3_______ times. Complete this exercise _____3_____ times a day. Wrist extension  Sit with your left / right forearm supported on a table or other surface. Bend your elbow to a 90-degree angle (right angle), and rest your hand palm-down over the edge of the table. Hold a __none________ weight in your left / right hand. Or, hold an exercise band or tube in both hands, keeping your hands at the same level and hip distance apart. There should be a slight tension in the exercise band or tube. Slowly curl your hand up toward the ceiling (extension). Hold this position for ___15_______ seconds. Slowly lower your hand back to the starting position. Repeat ___3_______ times. Complete this exercise ____3______ times a day. Forearm rotation, supination  Sit with your left / right forearm supported on a table or other surface. Bend your elbow to a 90-degree angle (right angle). Position your forearm so that your thumb is facing the ceiling (neutral position) and your hand is resting over the edge of the table. Hold a hammer in your left / right hand. This exercise will be easier if you hold the hammer near the head of the hammer. This exercise will be harder if you hold the hammer near the end of the handle. Without moving your elbow, slowly rotate your palm up  toward the ceiling (supination). Hold this position for ____15______ seconds. Slowly return to the starting position. Repeat ___3_______ times. Complete this exercise ____3______ times a day. Forearm rotation, pronation  Sit with your left / right forearm supported on a table or other surface. Bend your elbow to a 90-degree angle (right angle). Position your forearm so that the thumb is facing the ceiling (neutral position), with your hand resting over the edge of the table. Hold a hammer in your left / right hand. This  exercise will be easier if you hold the hammer near the head of the hammer. This exercise will be harder if you hold the hammer near the end of the handle. Without moving your elbow, slowly rotate your palm down toward the floor (pronation). Hold this position for ____15______ seconds. Slowly return to the starting position. Repeat _____3_____ times. Complete this exercise _____3_____ times a day. Grip strengthening  Grasp a stress ball or other ball in the middle of your left / right hand. Start with your elbow bent to a 90-degree angle (right angle). Slowly increase the pressure, squeezing the ball as hard as you can without causing pain. Think of bringing the tips of your fingers into the middle of your palm. All of your finger joints should bend when doing this exercise. To make this exercise harder, gradually try to straighten your elbow in front of you, until you can do the exercise with your elbow fully straight. Hold your squeeze for __15________ seconds, then relax. If instructed by your health care provider, do this exercise: With your forearm positioned so that the thumb is facing the ceiling (neutral position). With your forearm turned palm down. With your forearm turned palm up. Repeat ____3______ times. Complete this exercise ____3 Preventing Hypertension Hypertension, commonly called high blood pressure, is when the force of blood pumping through the arteries is too strong. Arteries are blood vessels that carry blood from the heart throughout the body. Over time, hypertension can damage the arteries and decrease blood flow to important parts of the body, including the brain, heart, and kidneys. Often, hypertension does not cause symptoms until blood pressure is very high. For this reason, it is important to have your blood pressure checked on a regular basis. Hypertension can often be prevented with diet and lifestyle changes. If you already have hypertension, you can control it  with diet and lifestyle changes, as well as medicine. What nutrition changes can be made? Maintain a healthy diet. This includes: Eating less salt (sodium). Ask your health care provider how much sodium is safe for you to have. The general recommendation is to consume less than 1 tsp (2,300 mg) of sodium a day. Do not add salt to your food. Choose low-sodium options when grocery shopping and eating out. Limiting fats in your diet. You can do this by eating low-fat or fat-free dairy products and by eating less red meat. Eating more fruits, vegetables, and whole grains. Make a goal to eat: 1-2 cups of fresh fruits and vegetables each day. 3-4 servings of whole grains each day. Avoiding foods and beverages that have added sugars. Eating fish that contain healthy fats (omega-3 fatty acids), such as mackerel or salmon. If you need help putting together a healthy eating plan, try the DASH diet. This diet is high in fruits, vegetables, and whole grains. It is low in sodium, red meat, and added sugars. DASH stands for Dietary Approaches to Stop Hypertension. What lifestyle changes can be made?  Lose weight if  you are overweight. Losing just 3?5% of your body weight can help prevent or control hypertension. For example, if your present weight is 200 lb (91 kg), a loss of 3-5% of your weight means losing 6-10 lb (2.7-4.5 kg). Ask your health care provider to help you with a diet and exercise plan to safely lose weight. Get enough exercise. Do at least 150 minutes of moderate-intensity exercise each week. You could do this in short exercise sessions several times a day, or you could do longer exercise sessions a few times a week. For example, you could take a brisk 10-minute walk or bike ride, 3 times a day, for 5 days a week. Find ways to reduce stress, such as exercising, meditating, listening to music, or taking a yoga class. If you need help reducing stress, ask your health care provider. Do not  smoke. This includes e-cigarettes. Chemicals in tobacco and nicotine products raise your blood pressure each time you smoke. If you need help quitting, ask your health care provider. Avoid alcohol. If you drink alcohol, limit alcohol intake to no more than 1 drink a day for nonpregnant women and 2 drinks a day for men. One drink equals 12 oz of beer, 5 oz of wine, or 1 oz of hard liquor. Why are these changes important? Diet and lifestyle changes can help you prevent hypertension, and they may make you feel better overall and improve your quality of life. If you have hypertension, making these changes will help you control it and help prevent major complications, such as: Hardening and narrowing of arteries that supply blood to: Your heart. This can cause a heart attack. Your brain. This can cause a stroke. Your kidneys. This can cause kidney failure. Stress on your heart muscle, which can cause heart failure. What can I do to lower my risk? Work with your health care provider to make a hypertension prevention plan that works for you. Follow your plan and keep all follow-up visits as told by your health care provider. Learn how to check your blood pressure at home. Make sure that you know your personal target blood pressure, as told by your health care provider. How is this treated? In addition to diet and lifestyle changes, your health care provider may recommend medicines to help lower your blood pressure. You may need to try a few different medicines to find what works best for you. You also may need to take more than one medicine. Take over-the-counter and prescription medicines only as told by your health care provider. Where to find support Your health care provider can help you prevent hypertension and help you keep your blood pressure at a healthy level. Your local hospital or your community may also provide support services and prevention programs. The American Heart Association offers an  online support network at: https://www.lee.net/ Where to find more information Learn more about hypertension from: National Heart, Lung, and Blood Institute: https://www.peterson.org/ Centers for Disease Control and Prevention: AboutHD.co.nz American Academy of Family Physicians: http://familydoctor.org/familydoctor/en/diseases-conditions/high-blood-pressure.printerview.all.html Learn more about the DASH diet from: National Heart, Lung, and Blood Institute: WedMap.it Contact a health care provider if: You think you are having a reaction to medicines you have taken. You have recurrent headaches or feel dizzy. You have swelling in your ankles. You have trouble with your vision. Summary Hypertension often does not cause any symptoms until blood pressure is very high. It is important to get your blood pressure checked regularly. Diet and lifestyle changes are the most important steps in  preventing hypertension. By keeping your blood pressure in a healthy range, you can prevent complications like heart attack, heart failure, stroke, and kidney failure. Work with your health care provider to make a hypertension prevention plan that works for you. This information is not intended to replace advice given to you by your health care provider. Make sure you discuss any questions you have with your health care provider. Document Revised: 07/12/2018 Document Reviewed: 11/29/2015 Elsevier Patient Education  [employer]2020 Else[employer] Inc. ______ times a day. This information is not intended to replace advice given to you by your health care provider. Make sure you discuss any questions you have with your health care provider. Document Revised: 05/09/2018 Document Reviewed: 05/09/2018 Elsevier Patient Education  2020 Elsevier Inc.  Trigger Finger  Trigger finger, also called stenosing tenosynovitis,  is a  condition that causes a finger to get stuck in a bent position. Each finger has a tendon, which is a tough, cord-like tissue that connects muscle to bone, and each tendon passes through a tunnel of tissue called a tendon sheath. To move your finger, your tendon needs to glide freely through the sheath. Trigger finger happens when the tendon or the sheath thickens, making it difficult to move your finger. Trigger finger can affect any finger or a thumb. It may affect more than one finger. Mild cases may clear up with rest and medicine. Severe cases require more treatment. What are the causes? Trigger finger is caused by a thickened finger tendon or tendon sheath. The cause of this thickening is not known. What increases the risk? The following factors may make you more likely to develop this condition:  Doing activities that require a strong grip.  Having rheumatoid arthritis, gout, or diabetes.  Being 3340-42 years old.  Being male. What are the signs or symptoms? Symptoms of this condition include:  Pain when bending or straightening your finger.  Tenderness or swelling where your finger attaches to the palm of your hand.  A lump in the palm of your hand or on the inside of your finger.  Hearing a noise like a pop or a snap when you try to straighten your finger.  Feeling a catching or locking sensation when you try to straighten your finger.  Being unable to straighten your finger. How is this diagnosed? This condition is diagnosed based on your symptoms and a physical exam. How is this treated? This condition may be treated by:  Resting your finger and avoiding activities that make symptoms worse.  Wearing a finger splint to keep your finger extended.  Taking NSAIDs, such as ibuprofen, to relieve pain and swelling.  Doing gentle exercises to stretch the finger as told by your health care provider.  Having medicine that reduces swelling and inflammation (steroids) injected  into the tendon sheath. Injections may need to be repeated.  Having surgery to open the tendon sheath. This may be done if other treatments do not work and you cannot straighten your finger. You may need physical therapy after surgery. Follow these instructions at home: If you have a splint:  Wear the splint as told by your health care provider. Remove it only as told by your health care provider.  Loosen it if your fingers tingle, become numb, or turn cold and blue.  Keep it clean.  If the splint is not waterproof: ? Do not let it get wet. ? Cover it with a watertight covering when you take a bath or shower. Managing pain, stiffness, and swelling  If directed, apply heat to the affected area as often as told by your health care provider. Use the heat source that your health care provider recommends, such as a moist heat pack or a heating pad.  Place a towel between your skin and the heat source.  Leave the heat on for 20-30 minutes.  Remove the heat if your skin turns bright red. This is especially important if you are unable to feel pain, heat, or cold. You may have a greater risk of getting burned. If directed, put ice on the painful area. To do this:  If you have a removable splint, remove it as told by your health care provider.  Put ice in a plastic bag.  Place a towel between your skin and the bag or between your splint and the bag.  Leave the ice on for 20 minutes, 2-3 times a day.  Activity  Rest your finger as told by your health care provider. Avoid activities that make the pain worse.  Return to your normal activities as told by your health care provider. Ask your health care provider what activities are safe for you.  Do exercises as told by your health care provider.  Ask your health care provider when it is safe to drive if you have a splint on your hand. General instructions  Take over-the-counter and prescription medicines only as told by your health  care provider.  Keep all follow-up visits as told by your health care provider. This is important. Contact a health care provider if:  Your symptoms are not improving with home care. Summary  Trigger finger, also called stenosing tenosynovitis, causes your finger to get stuck in a bent position. This can make it difficult and painful to straighten your finger.  This condition develops when a finger tendon or tendon sheath thickens.  Treatment may include resting your finger, wearing a splint, and taking medicines.  In severe cases, surgery to open the tendon sheath may be needed. This information is not intended to replace advice given to you by your health care provider. Make sure you discuss any questions you have with your health care provider. Document Revised: 08/05/2018 Document Reviewed: 08/05/2018 Elsevier Patient Education  2020 Elsevier Inc.  Arthritis Arthritis is a term that is commonly used to refer to joint pain or joint disease. There are more than 100 types of arthritis. What are the causes? The most common cause of this condition is wear and tear of a joint. Other causes include:  Gout.  Inflammation of a joint.  An infection of a joint.  Sprains and other injuries near the joint.  A reaction to medicines or drugs, or an allergic reaction. In some cases, the cause may not be known. What are the signs or symptoms? The main symptom of this condition is pain in the joint during movement. Other symptoms include:  Redness, swelling, or stiffness at a joint.  Warmth coming from the joint.  Fever.  Overall feeling of illness. How is this diagnosed? This condition may be diagnosed with a physical exam and tests, including:  Blood tests.  Urine tests.  Imaging tests, such as X-rays, an MRI, or a CT scan. Sometimes, fluid is removed from a joint for testing. How is this treated? This condition may be treated with:  Treatment of the cause, if it is  known.  Rest.  Raising (elevating) the joint.  Applying cold or hot packs to the joint.  Medicines to improve symptoms and reduce inflammation.  Injections of a steroid such as cortisone into the joint to help reduce pain and inflammation. Depending on the cause of your arthritis, you may need to make lifestyle changes to reduce stress on your joint. Changes may include:  Exercising more.  Losing weight. Follow these instructions at home: Medicines  Take over-the-counter and prescription medicines only as told by your health care provider.  Do not take aspirin to relieve pain if your health care provider thinks that gout may be causing your pain. Activity  Rest your joint if told by your health care provider. Rest is important when your disease is active and your joint feels painful, swollen, or stiff.  Avoid activities that make the pain worse. It is important to balance activity with rest.  Exercise your joint regularly with range-of-motion exercises as told by your health care provider. Try doing low-impact exercise, such as: ? Swimming. ? Water aerobics. ? Biking. ? Walking. Managing pain, stiffness, and swelling      If directed, put ice on the joint. ? Put ice in a plastic bag. ? Place a towel between your skin and the bag. ? Leave the ice on for 20 minutes, 2-3 times per day.  If your joint is swollen, raise (elevate) it above the level of your heart if directed by your health care provider.  If your joint feels stiff in the morning, try taking a warm shower.  If directed, apply heat to the affected area as often as told by your health care provider. Use the heat source that your health care provider recommends, such as a moist heat pack or a heating pad. If you have diabetes, do not apply heat without permission from your health care provider. To apply heat: ? Place a towel between your skin and the heat source. ? Leave the heat on for 20-30 minutes. ? Remove  the heat if your skin turns bright red. This is especially important if you are unable to feel pain, heat, or cold. You may have a greater risk of getting burned. General instructions  Do not use any products that contain nicotine or tobacco, such as cigarettes, e-cigarettes, and chewing tobacco. If you need help quitting, ask your health care provider.  Keep all follow-up visits as told by your health care provider. This is important. Contact a health care provider if:  The pain gets worse.  You have a fever. Get help right away if:  You develop severe joint pain, swelling, or redness.  Many joints become painful and swollen.  You develop severe back pain.  You develop severe weakness in your leg.  You cannot control your bladder or bowels. Summary  Arthritis is a term that is commonly used to refer to joint pain or joint disease. There are more than 100 types of arthritis.  The most common cause of this condition is wear and tear of a joint. Other causes include gout, inflammation or infection of the joint, sprains, or allergies.  Symptoms of this condition include redness, swelling, or stiffness of the joint. Other symptoms include warmth, fever, or feeling ill.  This condition is treated with rest, elevation, medicines, and applying cold or hot packs.  Follow your health care provider's instructions about medicines, activity, exercises, and other home care treatments. This information is not intended to replace advice given to you by your health care provider. Make sure you discuss any questions you have with your health care provider. Document Revised: 02/25/2018 Document Reviewed: 02/25/2018 Elsevier Patient Education  2020 Elsevier Inc.  Hand Pain Many things can cause hand pain. Some common causes are:  An injury.  Repeating the same movement with your hand over and over (overuse).  Osteoporosis.  Arthritis.  Lumps in the tendons or joints of the hand and  wrist (ganglion cysts).  Nerve compression syndromes (carpal tunnel syndrome).  Inflammation of the tendons (tendinitis).  Infection. Follow these instructions at home: Pay attention to any changes in your symptoms. Take these actions to help with your discomfort: Managing pain, stiffness, and swelling   Take over-the-counter and prescription medicines only as told by your health care provider.  Wear a hand splint or support as told by your health care provider.  If directed, put ice on the affected area: ? Put ice in a plastic bag. ? Place a towel between your skin and the bag. ? Leave the ice on for 20 minutes, 2-3 times a day. Activity  Take breaks from repetitive activity often.  Avoid activities that make your pain worse.  Minimize stress on your hands and wrists as much as possible.  Do stretches or exercises as told by your health care provider.  Do not do activities that make your pain worse. Contact a health care provider if:  Your pain does not get better after a few days of self-care.  Your pain gets worse.  Your pain affects your ability to do your daily activities. Get help right away if:  Your hand becomes warm, red, or swollen.  Your hand is numb or tingling.  Your hand is extremely swollen or deformed.  Your hand or fingers turn white or blue.  You cannot move your hand, wrist, or fingers. Summary  Many things can cause hand pain.  Contact your health care provider if your pain does not get better after a few days of self care.  Minimize stress on your hands and wrists as much as possible.  Do not do activities that make your pain worse. This information is not intended to replace advice given to you by your health care provider. Make sure you discuss any questions you have with your health care provider. Document Revised: 12/14/2017 Document Reviewed: 12/14/2017 Elsevier Patient Education  2020 Elsevier Inc.  Paresthesia Paresthesia is  an abnormal burning or prickling sensation. It is usually felt in the hands, arms, legs, or feet. However, it may occur in any part of the body. Usually, paresthesia is not painful. It may feel like:  Tingling or numbness.  Buzzing.  Itching. Paresthesia may occur without any clear cause, or it may be caused by:  Breathing too quickly (hyperventilation).  Pressure on a nerve.  An underlying medical condition.  Side effects of a medication.  Nutritional deficiencies.  Exposure to toxic chemicals. Most people experience temporary (transient) paresthesia at some time in their lives. For some people, it may be long-lasting (chronic) because of an underlying medical condition. If you have paresthesia that lasts a long time, you may need to be evaluated by your health care provider. Follow these instructions at home: Alcohol use   Do not drink alcohol if: ? Your health care provider tells you not to drink. ? You are pregnant, may be pregnant, or are planning to become pregnant.  If you drink alcohol: ? Limit how much you use to:  0-1 drink a day for women.  0-2 drinks a day for men. ? Be aware of how much alcohol is in your drink. In the [place] one drink equals one 12  oz bottle of beer (355 mL), one 5 oz glass of wine (148 mL), or one 1 oz glass of hard liquor (44 mL). Nutrition   Eat a healthy diet. This includes: ? Eating foods that are high in fiber, such as fresh fruits and vegetables, whole grains, and beans. ? Limiting foods that are high in fat and processed sugars, such as fried or sweet foods. General instructions  Take over-the-counter and prescription medicines only as told by your health care provider.  Do not use any products that contain nicotine or tobacco, such as cigarettes and e-cigarettes. These can keep blood from reaching damaged nerves. If you need help quitting, ask your health care provider.  If you have diabetes, work closely with your health care  provider to keep your blood sugar under control.  If you have numbness in your feet: ? Check every day for signs of injury or infection. Watch for redness, warmth, and swelling. ? Wear padded socks and comfortable shoes. These help protect your feet.  Keep all follow-up visits as told by your health care provider. This is important. Contact a health care provider if you:  Have paresthesia that gets worse or does not go away.  Have a burning or prickling feeling that gets worse when you walk.  Have pain, cramps, or dizziness.  Develop a rash. Get help right away if you:  Feel weak.  Have trouble walking or moving.  Have problems with speech, understanding, or vision.  Feel confused.  Cannot control your bladder or bowel movements.  Have numbness after an injury.  Develop new weakness in an arm or leg.  Faint. Summary  Paresthesia is an abnormal burning or prickling sensation that is usually felt in the hands, arms, legs, or feet. It may also occur in other parts of the body.  Paresthesia may occur without any clear cause, or it may be caused by breathing too quickly (hyperventilation), pressure on a nerve, an underlying medical condition, side effects of a medication, nutritional deficiencies, or exposure to toxic chemicals.  If you have paresthesia that lasts a long time, you may need to be evaluated by your health care provider. This information is not intended to replace advice given to you by your health care provider. Make sure you discuss any questions you have with your health care provider. Document Revised: 04/15/2018 Document Reviewed: 03/29/2017 Elsevier Patient Education  2020 Elsevier Inc.  Preventing Carpal Tunnel Syndrome  Carpal tunnel syndrome is a condition that causes pain, numbness, and weakness in the wrist, hand, and fingers. The carpal tunnel is a narrow, hollow space in the wrist. Tendons and one of the main nerves in the hand (median nerve) pass  through the carpal tunnel. The median nerve supplies feeling to the thumb and the first three fingers. It also supplies the muscles at the base of the thumb. Carpal tunnel syndrome happens when the median nerve gets squeezed in the area where it passes through the carpal tunnel. In some cases, it may not be possible to prevent carpal tunnel syndrome. However, you can take steps to relieve pressure on your wrist and reduce your risk of developing this condition. How can this condition affect me? Carpal tunnel syndrome can affect your ability to do jobs or activities that involve hand, wrist, and finger action. It can cause symptoms such as:  Pain in the wrist, hand, and fingers.  Burning, tingling, or numbness in the affected area.  A weak feeling in your hands. You may have  trouble grabbing and holding items. Symptoms may get worse over time. For some people, symptoms get worse at night. What can increase my risk? The following factors may make you more likely to develop this condition:  Having a job that requires you to repeatedly move your wrist or requires you to use tools that vibrate. This may include jobs that involve using computers, working on an [employer], or working with power tools such as Radiographer, therapeutic.  Being a woman.  Having a family history of the condition.  Having certain conditions, such as: ? Diabetes. ? Pregnancy. ? Obesity. ? Thyroid disease. ? Rheumatoid arthritis. What actions can I take to help prevent this condition?      Avoid making repetitive hand and wrist motions that cause your wrist to get stiff or painful.  Take frequent breaks if you use your hands and wrists for many hours at a time.  Stretch your hands and fingers often to get blood flowing and relieve tension.  Keep your wrists in the natural position when using a computer keyboard or mouse. Do not bend your wrists downward or sideways.  If you use your hands and wrists for many  hours at work, make changes to your work space to ease pressure on your wrists. You may want to use: ? A padded wrist rest for computer work. ? A slanted computer keyboard. ? Hand tools with padded handles to reduce vibrations.  Consider wearing a wrist brace. This will not prevent carpal tunnel syndrome but may keep it from getting worse. A wrist brace reduces bending and stress.  Closely manage any medical conditions you have that can put you at risk for carpal tunnel syndrome. Have your blood sugar checked to make sure you are not developing diabetes. If you have diabetes, work with your health care provider to keep your blood sugar under control. Where to find more information  [employer] of Neurological Disorders and Stroke: BasicFM.no  American Academy of [employer]icians: Hydrologist.org Contact a health care provider if:  You have numbness or tingling in your wrist, hand, or fingers.  You have pain or a burning sensation in your wrist, hand, or fingers.  Pain, tingling, or burning wakes you up at night.  Your hand becomes weak and clumsy.  You frequently drop objects.  You are unable to use your wrists and hands without pain. Summary  Carpal tunnel syndrome is a condition that causes pain, numbness, and weakness in the wrist, hand, and fingers.  You can take steps to relieve pressure on your wrist and reduce your risk of developing this condition.  Avoid making repetitive hand and wrist motions that cause your wrist to get stiff or painful.  If you use your hands and wrists for many hours at work, you may want to make changes to your work space to ease pressure on your wrists.  Take frequent breaks to stretch your hands and fingers. This information is not intended to replace advice given to you by your health care provider. Make sure you discuss any questions you have with your health care provider. Document Revised: 08/02/2017 Document Reviewed:  08/02/2017 Elsevier Patient Education  2020 Elsevier Inc.  Carpal Tunnel Syndrome  Carpal tunnel syndrome is a condition that causes pain in your hand and arm. The carpal tunnel is a narrow area located on the palm side of your wrist. Repeated wrist motion or certain diseases may cause swelling within the tunnel. This swelling pinches the main nerve in  the wrist (median nerve). What are the causes? This condition may be caused by:  Repeated wrist motions.  Wrist injuries.  Arthritis.  A cyst or tumor in the carpal tunnel.  Fluid buildup during pregnancy. Sometimes the cause of this condition is not known. What increases the risk? The following factors may make you more likely to develop this condition:  Having a job, such as being a Haematologist, that requires you to repeatedly move your wrist in the same motion.  Being a woman.  Having certain conditions, such as: ? Diabetes. ? Obesity. ? An underactive thyroid (hypothyroidism). ? Kidney failure. What are the signs or symptoms? Symptoms of this condition include:  A tingling feeling in your fingers, especially in your thumb, index, and middle fingers.  Tingling or numbness in your hand.  An aching feeling in your entire arm, especially when your wrist and elbow are bent for a long time.  Wrist pain that goes up your arm to your shoulder.  Pain that goes down into your palm or fingers.  A weak feeling in your hands. You may have trouble grabbing and holding items. Your symptoms may feel worse during the night. How is this diagnosed? This condition is diagnosed with a medical history and physical exam. You may also have tests, including:  Electromyogram (EMG). This test measures electrical signals sent by your nerves into the muscles.  Nerve conduction study. This test measures how well electrical signals pass through your nerves.  Imaging tests, such as X-rays, ultrasound, and MRI. These tests check for  possible causes of your condition. How is this treated? This condition may be treated with:  Lifestyle changes. It is important to stop or change the activity that caused your condition.  Doing exercise and activities to strengthen your muscles and bones (physical therapy).  Learning how to use your hand again after diagnosis (occupational therapy).  Medicines for pain and inflammation. This may include medicine that is injected into your wrist.  A wrist splint.  Surgery. Follow these instructions at home: If you have a splint:  Wear the splint as told by your health care provider. Remove it only as told by your health care provider.  Loosen the splint if your fingers tingle, become numb, or turn cold and blue.  Keep the splint clean.  If the splint is not waterproof: ? Do not let it get wet. ? Cover it with a watertight covering when you take a bath or shower. Managing pain, stiffness, and swelling   If directed, put ice on the painful area: ? If you have a removable splint, remove it as told by your health care provider. ? Put ice in a plastic bag. ? Place a towel between your skin and the bag. ? Leave the ice on for 20 minutes, 2-3 times per day. General instructions  Take over-the-counter and prescription medicines only as told by your health care provider.  Rest your wrist from any activity that may be causing your pain. If your condition is work related, talk with your employer about changes that can be made, such as getting a wrist pad to use while typing.  Do any exercises as told by your health care provider, physical therapist, or occupational therapist.  Keep all follow-up visits as told by your health care provider. This is important. Contact a health care provider if:  You have new symptoms.  Your pain is not controlled with medicines.  Your symptoms get worse. Get help right  away if:  You have severe numbness or tingling in your wrist or  hand. Summary  Carpal tunnel syndrome is a condition that causes pain in your hand and arm.  It is usually caused by repeated wrist motions.  Lifestyle changes and medicines are used to treat carpal tunnel syndrome. Surgery may be recommended.  Follow your health care provider's instructions about wearing a splint, resting from activity, keeping follow-up visits, and calling for help. This information is not intended to replace advice given to you by your health care provider. Make sure you discuss any questions you have with your health care provider. Document Revised: 07/27/2017 Document Reviewed: 07/27/2017 Elsevier Patient Education  2020 [employer].
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
## 2019-07-16 NOTE — Progress Notes (Signed)
Left hand painful & aching x 3-4 months.  Taking ibuprofen. No Dx of Arthritis, but questioning if this is what it is. No new or recent injury. Has had injury & surgery to hand in the past

## 2019-08-11 ENCOUNTER — Telehealth: Payer: Self-pay | Admitting: Registered Nurse

## 2019-08-11 NOTE — Telephone Encounter (Signed)
Patient was instructed to have walk in hand xray at [place] location.  Epic notified me today he has not completed xray.  Please verify if hand pain has resolved or if continuing patient needs to complete xray and follow up for re-evaluation with [facility] provider.

## 2019-08-12 NOTE — Telephone Encounter (Signed)
Called [facility] & he states hand started feeling better & he chose not to have the xray.  Doesn't need to follow-up with a provider at this time.  Will contact us if it starts bothering him again.  AMD

## 2019-08-14 NOTE — Telephone Encounter (Signed)
Noted hand pain improved patient does not want to complete xray and patient will follow up in clinic if hand pain worsening again

## 2019-12-29 ENCOUNTER — Other Ambulatory Visit: Payer: Self-pay

## 2019-12-29 DIAGNOSIS — Z1152 Encounter for screening for COVID-19: Secondary | ICD-10-CM

## 2019-12-29 NOTE — Progress Notes (Signed)
covid testing - exposure in the home - [facility]

## 2019-12-30 ENCOUNTER — Ambulatory Visit: Payer: Self-pay

## 2019-12-30 DIAGNOSIS — Z0000 Encounter for general adult medical examination without abnormal findings: Secondary | ICD-10-CM

## 2019-12-30 LAB — POCT URINALYSIS DIPSTICK
Bilirubin, UA: NEGATIVE
Blood, UA: NEGATIVE
Glucose, UA: NEGATIVE
Leukocytes, UA: NEGATIVE
Nitrite, UA: NEGATIVE
Protein, UA: NEGATIVE
Spec Grav, UA: 1.02 (ref 1.010–1.025)
Urobilinogen, UA: 0.2 E.U./dL
pH, UA: 6 (ref 5.0–8.0)

## 2019-12-30 NOTE — Progress Notes (Signed)
Scheduled to complete physical 01/09/20 with Durward Parcel, PA-C.  AMD

## 2019-12-31 LAB — NOVEL CORONAVIRUS, NAA: SARS-CoV-2, NAA: NOT DETECTED

## 2019-12-31 LAB — SARS-COV-2, NAA 2 DAY TAT

## 2020-01-02 LAB — CMP12+LP+TP+TSH+6AC+PSA+CBC…
ALT: 29 IU/L (ref 0–44)
AST: 18 IU/L (ref 0–40)
Albumin/Globulin Ratio: 2.3 — ABNORMAL HIGH (ref 1.2–2.2)
Albumin: 4.6 g/dL (ref 4.0–5.0)
Alkaline Phosphatase: 82 IU/L (ref 44–121)
BUN/Creatinine Ratio: 11 (ref 9–20)
BUN: 10 mg/dL (ref 6–24)
Basophils Absolute: 0.1 10*3/uL (ref 0.0–0.2)
Basos: 1 %
Bilirubin Total: 0.9 mg/dL (ref 0.0–1.2)
Calcium: 9.4 mg/dL (ref 8.7–10.2)
Chloride: 102 mmol/L (ref 96–106)
Chol/HDL Ratio: 5.9 ratio — ABNORMAL HIGH (ref 0.0–5.0)
Cholesterol, Total: 232 mg/dL — ABNORMAL HIGH (ref 100–199)
Creatinine, Ser: 0.93 mg/dL (ref 0.76–1.27)
EOS (ABSOLUTE): 0.1 10*3/uL (ref 0.0–0.4)
Eos: 3 %
Estimated CHD Risk: 1.3 times avg. — ABNORMAL HIGH (ref 0.0–1.0)
Free Thyroxine Index: 1.5 (ref 1.2–4.9)
GFR calc Af Amer: 117 mL/min/{1.73_m2} (ref >59)
GFR calc non Af Amer: 101 mL/min/{1.73_m2} (ref >59)
GGT: 30 IU/L (ref 0–65)
Globulin, Total: 2 g/dL (ref 1.5–4.5)
Glucose: 99 mg/dL (ref 65–99)
HDL: 39 mg/dL — ABNORMAL LOW (ref >39)
Hematocrit: 46.5 % (ref 37.5–51.0)
Hemoglobin: 16.1 g/dL (ref 13.0–17.7)
Immature Grans (Abs): 0 10*3/uL (ref 0.0–0.1)
Immature Granulocytes: 0 %
Iron: 142 ug/dL (ref 38–169)
LDH: 144 IU/L (ref 121–224)
LDL Chol Calc (NIH): 165 mg/dL — ABNORMAL HIGH (ref 0–99)
Lymphocytes Absolute: 1.3 10*3/uL (ref 0.7–3.1)
Lymphs: 25 %
MCH: 31.8 pg (ref 26.6–33.0)
MCHC: 34.6 g/dL (ref 31.5–35.7)
MCV: 92 fL (ref 79–97)
Monocytes Absolute: 0.5 10*3/uL (ref 0.1–0.9)
Monocytes: 9 %
Neutrophils Absolute: 3.1 10*3/uL (ref 1.4–7.0)
Neutrophils: 62 %
Phosphorus: 2.5 mg/dL — ABNORMAL LOW (ref 2.8–4.1)
Platelets: 281 10*3/uL (ref 150–450)
Potassium: 4.4 mmol/L (ref 3.5–5.2)
Prostate Specific Ag, Serum: 0.4 ng/mL (ref 0.0–4.0)
RBC: 5.07 x10E6/uL (ref 4.14–5.80)
RDW: 12.4 % (ref 11.6–15.4)
Sodium: 137 mmol/L (ref 134–144)
T3 Uptake Ratio: 22 % — ABNORMAL LOW (ref 24–39)
T4, Total: 6.6 ug/dL (ref 4.5–12.0)
TSH: 4.15 u[IU]/mL (ref 0.450–4.500)
Total Protein: 6.6 g/dL (ref 6.0–8.5)
Triglycerides: 152 mg/dL — ABNORMAL HIGH (ref 0–149)
Uric Acid: 7.7 mg/dL (ref 3.8–8.4)
VLDL Cholesterol Cal: 28 mg/dL (ref 5–40)
WBC: 5.1 10*3/uL (ref 3.4–10.8)

## 2020-01-02 LAB — QUANTIFERON-TB GOLD PLUS
QuantiFERON Mitogen Value: >10 IU/mL
QuantiFERON Nil Value: 0.05 IU/mL
QuantiFERON TB1 Ag Value: 0.08 IU/mL
QuantiFERON TB2 Ag Value: 0.11 IU/mL
QuantiFERON-TB Gold Plus: NEGATIVE

## 2020-01-09 ENCOUNTER — Encounter: Payer: 59 | Admitting: Physician Assistant

## 2020-01-16 ENCOUNTER — Other Ambulatory Visit: Payer: Self-pay

## 2020-01-16 ENCOUNTER — Encounter: Payer: Self-pay | Admitting: Physician Assistant

## 2020-01-16 ENCOUNTER — Ambulatory Visit: Payer: Self-pay | Admitting: Physician Assistant

## 2020-01-16 VITALS — BP 130/94 | HR 78 | Temp 98.5°F | Resp 16 | Ht 69.0 in | Wt 217.0 lb

## 2020-01-16 DIAGNOSIS — Z0000 Encounter for general adult medical examination without abnormal findings: Secondary | ICD-10-CM

## 2020-01-16 DIAGNOSIS — J0100 Acute maxillary sinusitis, unspecified: Secondary | ICD-10-CM

## 2020-01-16 MED ORDER — PSEUDOEPH-BROMPHEN-DM 30-2-10 MG/5ML PO SYRP: 5.0000 mL | ORAL_SOLUTION | Freq: Four times a day (QID) | ORAL | 0 refills | Status: DC | PRN

## 2020-01-16 MED ORDER — SIMVASTATIN 40 MG PO TABS: 40.0000 mg | ORAL_TABLET | Freq: Every day | ORAL | 3 refills | Status: DC

## 2020-01-16 MED ORDER — AMOXICILLIN-POT CLAVULANATE 875-125 MG PO TABS: 1.0000 | ORAL_TABLET | Freq: Two times a day (BID) | ORAL | 0 refills | Status: DC

## 2020-01-16 NOTE — Progress Notes (Signed)
° °  Subjective: Annual physical exam    Patient ID: David Weber, male    DOB: 09/06/1975, 44 y.o.   MRN: [id number]  HPI Patient presents annual physical exam.  Patient complaint of approximately 2 weeks of sinus congestion and productive cough.  Patient's had for negative COVID-19 test.   Review of Systems Sinus congestion    Objective:   Physical Exam No acute distress.  HEENT is remarkable for edematous nasal turbinates with thick rhinorrhea.  Patient has postnasal drainage.  Neck is supple for adenopathy or bruits.  Lungs: Rhonchi breath sounds.  Heart is regular rate and rhythm.  Abdomen is negative HSM, normoactive bowel sounds, soft and nontender to palpation.  No obvious deformity to the upper or lower extremities.  Patient has full and equal range of motion of the upper and lower extremities.  No obvious cervical or lumbar spine deformity.  Patient has full neck range of motion cervical lumbar spine.  Cranial nerves II through XII grossly intact.       Assessment & Plan: Well exam.  Patient physical exam is consistent with sinusitis.  Discussed patient labs show an elevated cholesterol and triglycerides.  Patient amenable to starting statins.  Advised patient to have cholesterol rechecked in 3 months.  Patient given prescription for Augmentin, Bromfed-DM, and Zocor.

## 2020-01-16 NOTE — Progress Notes (Signed)
Pt presents today to complete physical with Durward Parcel PA-C.

## 2020-01-21 ENCOUNTER — Other Ambulatory Visit: Payer: Self-pay

## 2020-01-21 DIAGNOSIS — J0100 Acute maxillary sinusitis, unspecified: Secondary | ICD-10-CM

## 2020-01-21 DIAGNOSIS — E7849 Other hyperlipidemia: Secondary | ICD-10-CM

## 2020-01-21 MED ORDER — SIMVASTATIN 40 MG PO TABS: 40.0000 mg | ORAL_TABLET | Freq: Every day | ORAL | 3 refills | Status: DC

## 2020-01-21 MED ORDER — PSEUDOEPH-BROMPHEN-DM 30-2-10 MG/5ML PO SYRP: 5.0000 mL | ORAL_SOLUTION | Freq: Four times a day (QID) | ORAL | 0 refills | Status: DC | PRN

## 2020-01-21 MED ORDER — AMOXICILLIN-POT CLAVULANATE 875-125 MG PO TABS: 1.0000 | ORAL_TABLET | Freq: Two times a day (BID) | ORAL | 0 refills | Status: DC

## 2020-02-05 ENCOUNTER — Ambulatory Visit: Payer: Self-pay

## 2020-02-05 DIAGNOSIS — Z23 Encounter for immunization: Secondary | ICD-10-CM

## 2020-06-21 DIAGNOSIS — S86112A Strain of other muscle(s) and tendon(s) of posterior muscle group at lower leg level, left leg, initial encounter: Secondary | ICD-10-CM | POA: Diagnosis not present

## 2020-09-27 ENCOUNTER — Other Ambulatory Visit: Payer: Self-pay

## 2020-09-27 ENCOUNTER — Ambulatory Visit: Payer: Self-pay

## 2020-09-27 VITALS — BP 134/92

## 2020-09-27 DIAGNOSIS — Z0130 Encounter for examination of blood pressure without abnormal findings: Secondary | ICD-10-CM

## 2020-09-27 NOTE — Progress Notes (Signed)
BP = 142/90 - Automatic Cuff  States he knows he has a lot of sodium in his diet & working on decreasing.  States he purchased an automatic BP cuff & it was running 130's over 80's over the weekend.  AMD

## 2020-09-29 ENCOUNTER — Ambulatory Visit: Payer: Self-pay | Admitting: Adult Health

## 2020-09-29 ENCOUNTER — Other Ambulatory Visit: Payer: Self-pay

## 2020-09-29 VITALS — BP 130/96 | HR 85 | Temp 97.8°F | Resp 14 | Ht 69.0 in | Wt 213.0 lb

## 2020-09-29 DIAGNOSIS — I10 Essential (primary) hypertension: Secondary | ICD-10-CM

## 2020-09-29 MED ORDER — AMLODIPINE BESYLATE 2.5 MG PO TABS: 2.5000 mg | ORAL_TABLET | Freq: Every day | ORAL | 2 refills | Status: DC

## 2020-09-29 NOTE — Progress Notes (Signed)
As of last night bpi 150/92, today 130/96 with slight tension head ache that's been going on for about a week. CL,RMA

## 2020-09-29 NOTE — Progress Notes (Signed)
[facility] [address], [place] [address]   Office Visit Note  Patient Name: David Weber  [id number]  [id number]  Date of Service: 09/29/2020  Chief Complaint  Patient presents with   Headache   Hypertension     HPI Pt is here for a sick visit. Patient reports he has been having headaches over the last week.  He knows he has borderline HTN, and has been checking his bp.  He has been getting 130-150's/90-100's. At home.  He drinks water, and has lost 10 pounds recently be giving up his morning coffee with cream/sugar.  He does dip tobacco, and drinks alcohol occassionally.     Current Medication:  Outpatient Encounter Medications as of 09/29/2020  Medication Sig   amLODipine (NORVASC) 2.5 MG tablet Take 1 tablet (2.5 mg total) by mouth daily.   simvastatin (ZOCOR) 40 MG tablet Take 1 tablet (40 mg total) by mouth at bedtime.   [DISCONTINUED] amoxicillin-clavulanate (AUGMENTIN) 875-125 MG tablet Take 1 tablet by mouth 2 (two) times daily.   [DISCONTINUED] brompheniramine-pseudoephedrine-DM 30-2-10 MG/5ML syrup Take 5 mLs by mouth 4 (four) times daily as needed.   No facility-administered encounter medications on file as of 09/29/2020.      Medical History: Past Medical History:  Diagnosis Date   DDD (degenerative disc disease), cervical    Elevated lipids    Elevated PSA    Neuropathy of hand, left    Overweight      Vital Signs: BP (!) 130/96   Pulse 85   Temp 97.8 F (36.6 C)   Resp 14   Ht 5\' 9"  (1.753 m)   Wt 213 lb (96.6 kg)   SpO2 97%   BMI 31.45 kg/m    Review of Systems  Constitutional:  Negative for activity change, appetite change, chills and diaphoresis.  HENT:  Negative for congestion, facial swelling, nosebleeds and postnasal drip.   Respiratory:  Negative for cough, shortness of breath and wheezing.   Cardiovascular:  Negative for chest pain, palpitations and leg swelling.  Neurological:  Positive for headaches.    Physical Exam Constitutional:      Appearance: He is well-developed.  HENT:     Head: Normocephalic.  Cardiovascular:     Rate and Rhythm: Normal rate and regular rhythm.     Heart sounds: Normal heart sounds.  Pulmonary:     Effort: No respiratory distress.     Breath sounds: No wheezing or rales.  Neurological:     Mental Status: He is alert and oriented to person, place, and time.      Assessment/Plan: 1. Hypertension, unspecified type Start Amlodipine.  Continue diet and exercise routine.  Log BP's at home,and if pressure remains elevated return to clinic.  Repeat manual bp was 142/88.  - amLODipine (NORVASC) 2.5 MG tablet; Take 1 tablet (2.5 mg total) by mouth daily.  Dispense: 30 tablet; Refill: 2     General Counseling: Yi verbalizes understanding of the findings of todays visit and agrees with plan of treatment. I have discussed any further diagnostic evaluation that may be needed or ordered today. We also reviewed his medications today. he has been encouraged to call the office with any questions or concerns that should arise related to todays visit.   No orders of the defined types were placed in this encounter.   Meds ordered this encounter  Medications   amLODipine (NORVASC) 2.5 MG tablet    Sig: Take 1 tablet (2.5 mg total)  by mouth daily.    Dispense:  30 tablet    Refill:  2    Time spent:30 Minutes    Johnna Acosta AGNP-C Nurse Practitioner

## 2020-11-17 ENCOUNTER — Ambulatory Visit: Payer: Self-pay

## 2020-11-17 ENCOUNTER — Other Ambulatory Visit: Payer: Self-pay

## 2020-11-17 VITALS — BP 130/92 | HR 77

## 2020-11-17 DIAGNOSIS — Z0130 Encounter for examination of blood pressure without abnormal findings: Secondary | ICD-10-CM

## 2020-11-17 NOTE — Progress Notes (Signed)
Pt presents today to check BP. CL,RMA

## 2020-12-07 ENCOUNTER — Ambulatory Visit: Payer: Self-pay | Admitting: Physician Assistant

## 2020-12-07 ENCOUNTER — Encounter: Payer: Self-pay | Admitting: Physician Assistant

## 2020-12-07 ENCOUNTER — Other Ambulatory Visit: Payer: Self-pay

## 2020-12-07 DIAGNOSIS — J301 Allergic rhinitis due to pollen: Secondary | ICD-10-CM

## 2020-12-07 MED ORDER — ZOLPIDEM TARTRATE 5 MG PO TABS: 5.0000 mg | ORAL_TABLET | Freq: Every evening | ORAL | 1 refills | Status: DC | PRN

## 2020-12-07 MED ORDER — ORPHENADRINE CITRATE ER 100 MG PO TB12: 100.0000 mg | ORAL_TABLET | Freq: Two times a day (BID) | ORAL | 0 refills | Status: DC

## 2020-12-07 MED ORDER — FEXOFENADINE-PSEUDOEPHED ER 60-120 MG PO TB12: 1.0000 | ORAL_TABLET | Freq: Two times a day (BID) | ORAL | 0 refills | Status: DC

## 2020-12-07 NOTE — Progress Notes (Signed)
   Subjective: Sinus congestion    Patient ID: David Weber, male    DOB: October 28, 1975, 45 y.o.   MRN: [id number]  HPI Patient presents with 1 week of sinus congestion with postnasal drainage.  Patient admits to seasonal complaint.  Patient is tested negative for COVID x2.  Patient takes intermitting Zyrtec's but no noticeable relief.   Review of Systems Hyperlipidemia, hypertension, and insomnia.    Objective:   Physical Exam  This is a virtual visit.      Assessment & Plan: Allergic rhinitis   Patient given a prescription for fexofenadine and Sudafed.  Advised to discontinue Zyrtec's while taking this medication.  Follow-up in 1 week if no improvement.

## 2020-12-07 NOTE — Progress Notes (Signed)
   Subjective:    Patient ID: David Weber, male    DOB: October 01, 1975, 45 y.o.   MRN: [id number]  HPI    Review of Systems     Objective:   Physical Exam        Assessment & Plan:

## 2020-12-07 NOTE — Progress Notes (Signed)
S/Sx started Saturday: Sore throat from drainage - resolved Sinus drainage Nasal congestion - yellowish mucus Cough - worse at night Denies H/A, facial pain/pressure, ear discomfort, fever  Home covid test on Sunday & Monday - both were negative  AMD

## 2020-12-13 ENCOUNTER — Other Ambulatory Visit: Payer: Self-pay

## 2020-12-13 ENCOUNTER — Ambulatory Visit: Payer: Self-pay | Admitting: Physician Assistant

## 2020-12-13 ENCOUNTER — Encounter: Payer: Self-pay | Admitting: Physician Assistant

## 2020-12-13 VITALS — BP 141/97 | HR 94 | Temp 97.6°F | Resp 14 | Ht 69.0 in | Wt 210.0 lb

## 2020-12-13 DIAGNOSIS — I10 Essential (primary) hypertension: Secondary | ICD-10-CM

## 2020-12-13 MED ORDER — AMLODIPINE BESYLATE 10 MG PO TABS: 10.0000 mg | ORAL_TABLET | Freq: Every day | ORAL | 2 refills | Status: DC

## 2020-12-13 NOTE — Progress Notes (Signed)
   Subjective: Hypertension    Patient ID: David Weber, male    DOB: 1975/06/18, 45 y.o.   MRN: [id number]  HPI Patient presents for evaluation of hypertension.  Patient started on Norvasc 2.5 mg daily 3 months ago.  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  Patient denies headache, vision disturbance, or vertigo.  Patient denies any edema to the upper or lower extremities.   Review of Systems Hyperlipidemia, hypertension, insomnia, and seasonal rhinitis.    Objective:   Physical Exam No acute distress.  Temperature 97.6, pulse 94, respiration 14, BP 141/97, patient 98% O2 sat on room air.  Patient weighs 210 pounds BMI is 31.01. HEENT is unremarkable.  Neck is supple without adenopathy or bruits.  Lungs clear to auscultation.  Heart regular rate and rhythm.      Assessment & Plan: Hypertension.   Patient will increase Norvasc to 10 mg daily.  Patient will do a 3-day blood pressure check after 2 weeks.  Patient will follow-up in 2 weeks.

## 2021-01-05 ENCOUNTER — Ambulatory Visit: Payer: Self-pay | Admitting: Physician Assistant

## 2021-01-05 ENCOUNTER — Other Ambulatory Visit: Payer: Self-pay

## 2021-01-05 ENCOUNTER — Encounter: Payer: Self-pay | Admitting: Physician Assistant

## 2021-01-05 VITALS — BP 138/90 | HR 94 | Temp 98.0°F | Resp 14 | Ht 69.0 in | Wt 210.0 lb

## 2021-01-05 DIAGNOSIS — I10 Essential (primary) hypertension: Secondary | ICD-10-CM

## 2021-01-05 DIAGNOSIS — N528 Other male erectile dysfunction: Secondary | ICD-10-CM

## 2021-01-05 MED ORDER — SILDENAFIL CITRATE 100 MG PO TABS: 100.0000 mg | ORAL_TABLET | Freq: Every day | ORAL | 6 refills | Status: DC | PRN

## 2021-01-05 NOTE — Progress Notes (Signed)
   Subjective: Hypertension    Patient ID: David Weber, male    DOB: 05-11-1975, 45 y.o.   MRN: [id number]  HPI Patient here for evaluation of hypertension status post medication adjustment.  Patient was taking 2.5 mg of Norvasc which was not effective.  Patient was increased to 10 mg instead he noticed a difference in 3 days.  Patient also voiced concern for erectile dysfunction.  Patient is condition existed before he started taking blood pressure medication.  Patient state main concern is maintaining erection.  No history of BPH. Review of Systems Hypertension and insomnia.    Objective:   Physical Exam No acute distress.  Temperature is 98, pulse 94, respiration 14, BP is 130/90, and patient 97% O2 sat on room air. HEENT is unremarkable.  Neck is supple adenectomy or bruits.  Lungs clear to auscultation.  Heart regular rate and rhythm.  Examination of the lower extremity reveals no edema.       Assessment & Plan: Hypertension   Patient advised to continue Norvasc at 10 mg daily.  Patient given prescription for Viagra.  Patient vies follow-up as necessary.

## 2021-01-10 ENCOUNTER — Ambulatory Visit: Payer: Self-pay

## 2021-01-10 ENCOUNTER — Other Ambulatory Visit: Payer: Self-pay

## 2021-01-10 DIAGNOSIS — Z0000 Encounter for general adult medical examination without abnormal findings: Secondary | ICD-10-CM

## 2021-01-10 NOTE — Progress Notes (Signed)
01/17/21 scheduled annual physical.

## 2021-01-11 LAB — CMP12+LP+TP+TSH+6AC+PSA+CBC…
ALT: 35 IU/L (ref 0–44)
AST: 20 IU/L (ref 0–40)
Albumin/Globulin Ratio: 2.3 — ABNORMAL HIGH (ref 1.2–2.2)
Albumin: 4.6 g/dL (ref 4.0–5.0)
Alkaline Phosphatase: 95 IU/L (ref 44–121)
BUN/Creatinine Ratio: 9 (ref 9–20)
BUN: 8 mg/dL (ref 6–24)
Basophils Absolute: 0.1 10*3/uL (ref 0.0–0.2)
Basos: 1 %
Bilirubin Total: 0.7 mg/dL (ref 0.0–1.2)
Calcium: 9.1 mg/dL (ref 8.7–10.2)
Chloride: 102 mmol/L (ref 96–106)
Chol/HDL Ratio: 5.5 ratio — ABNORMAL HIGH (ref 0.0–5.0)
Cholesterol, Total: 229 mg/dL — ABNORMAL HIGH (ref 100–199)
Creatinine, Ser: 0.93 mg/dL (ref 0.76–1.27)
EOS (ABSOLUTE): 0.2 10*3/uL (ref 0.0–0.4)
Eos: 5 %
Estimated CHD Risk: 1.1 times avg. — ABNORMAL HIGH (ref 0.0–1.0)
Free Thyroxine Index: 2.1 (ref 1.2–4.9)
GGT: 39 IU/L (ref 0–65)
Globulin, Total: 2 g/dL (ref 1.5–4.5)
Glucose: 102 mg/dL — ABNORMAL HIGH (ref 70–99)
HDL: 42 mg/dL (ref >39)
Hematocrit: 44.9 % (ref 37.5–51.0)
Hemoglobin: 15.3 g/dL (ref 13.0–17.7)
Immature Grans (Abs): 0 10*3/uL (ref 0.0–0.1)
Immature Granulocytes: 0 %
Iron: 125 ug/dL (ref 38–169)
LDH: 160 IU/L (ref 121–224)
LDL Chol Calc (NIH): 159 mg/dL — ABNORMAL HIGH (ref 0–99)
Lymphocytes Absolute: 1.3 10*3/uL (ref 0.7–3.1)
Lymphs: 27 %
MCH: 31.1 pg (ref 26.6–33.0)
MCHC: 34.1 g/dL (ref 31.5–35.7)
MCV: 91 fL (ref 79–97)
Monocytes Absolute: 0.4 10*3/uL (ref 0.1–0.9)
Monocytes: 9 %
Neutrophils Absolute: 2.9 10*3/uL (ref 1.4–7.0)
Neutrophils: 58 %
Phosphorus: 2.4 mg/dL — ABNORMAL LOW (ref 2.8–4.1)
Platelets: 261 10*3/uL (ref 150–450)
Potassium: 4 mmol/L (ref 3.5–5.2)
Prostate Specific Ag, Serum: 0.4 ng/mL (ref 0.0–4.0)
RBC: 4.92 x10E6/uL (ref 4.14–5.80)
RDW: 12.2 % (ref 11.6–15.4)
Sodium: 139 mmol/L (ref 134–144)
T3 Uptake Ratio: 27 % (ref 24–39)
T4, Total: 7.8 ug/dL (ref 4.5–12.0)
TSH: 2.48 u[IU]/mL (ref 0.450–4.500)
Total Protein: 6.6 g/dL (ref 6.0–8.5)
Triglycerides: 153 mg/dL — ABNORMAL HIGH (ref 0–149)
Uric Acid: 8.1 mg/dL (ref 3.8–8.4)
VLDL Cholesterol Cal: 28 mg/dL (ref 5–40)
WBC: 4.9 10*3/uL (ref 3.4–10.8)
eGFR: 104 mL/min/{1.73_m2} (ref >59)

## 2021-01-14 NOTE — Addendum Note (Signed)
Addended by: Gardner Candle on: 01/14/2021 04:26 PM   Modules accepted: Orders

## 2021-01-17 ENCOUNTER — Ambulatory Visit: Payer: Self-pay | Admitting: Physician Assistant

## 2021-01-17 ENCOUNTER — Encounter: Payer: Self-pay | Admitting: Physician Assistant

## 2021-01-17 ENCOUNTER — Other Ambulatory Visit: Payer: Self-pay

## 2021-01-17 VITALS — BP 130/82 | HR 81 | Temp 97.7°F | Resp 14 | Ht 70.0 in | Wt 210.0 lb

## 2021-01-17 DIAGNOSIS — Z0000 Encounter for general adult medical examination without abnormal findings: Secondary | ICD-10-CM

## 2021-01-17 LAB — POCT URINALYSIS DIPSTICK
Bilirubin, UA: NEGATIVE
Blood, UA: NEGATIVE
Glucose, UA: NEGATIVE
Ketones, UA: NEGATIVE
Leukocytes, UA: NEGATIVE
Nitrite, UA: NEGATIVE
Protein, UA: NEGATIVE
Spec Grav, UA: 1.025 (ref 1.010–1.025)
Urobilinogen, UA: 0.2 E.U./dL
pH, UA: 6 (ref 5.0–8.0)

## 2021-01-17 NOTE — Progress Notes (Signed)
   Subjective: Annual firefighter exam    Patient ID: David Weber, male    DOB: 02/15/1976, 45 y.o.   MRN: [id number]  HPI Patient presents for annual firefighter exam.  Patient reports no concerns or complaints.  It was noted that patient cholesterol to be elevated.  Further history showed patient has noncompliance with statin.   Review of Systems Erectile dysfunction, hyperlipidemia, hypertension.    Objective:   Physical Exam No acute distress.  Temperature 97.7, pulse 84, respiration 14, BP is 130/82, patient 96% O2 sat on room air.  Patient weighs 210 pounds and BMI is 30.13. HEENT is unremarkable.  Neck is supple adenectomy or bruits.  Lungs clear to auscultation.  Heart is regular rate and rhythm.  EKG shows asymptomatic right bundle branch block. Abdomen with negative HSM, normoactive bowel sounds, soft, nontender to palpation. No obvious deformity to the upper or lower extremities.  Patient has full and equal range of motion of the upper and lower extremities. No obvious cervical or lumbar spine deformity.  Patient has full and equal range of motion cervical lumbar spine. Cranial nerves II through XII grossly intact.  DTR 2+ without clonus.      Assessment & Plan: Well exam  discussed lab results with patient.  Patient will start simvastatin have a follow-up fasting lipid profile in 3 months.

## 2021-01-17 NOTE — Progress Notes (Signed)
Pt denies any issues or concerns today. CL,RMA 

## 2021-01-17 NOTE — Addendum Note (Signed)
Addended by: Christianne Dolin F on: 01/17/2021 09:03 AM   Modules accepted: Orders

## 2021-04-19 ENCOUNTER — Other Ambulatory Visit: Payer: 59

## 2021-12-21 ENCOUNTER — Ambulatory Visit: Payer: Self-pay

## 2021-12-21 DIAGNOSIS — Z0000 Encounter for general adult medical examination without abnormal findings: Secondary | ICD-10-CM

## 2021-12-21 NOTE — Progress Notes (Signed)
Pt cleared pre-employment UDS, HR notified./CL,RMA 

## 2021-12-22 LAB — CMP12+LP+TP+TSH+6AC+PSA+CBC…
ALT: 24 IU/L (ref 0–44)
AST: 17 IU/L (ref 0–40)
Albumin/Globulin Ratio: 2.3 — ABNORMAL HIGH (ref 1.2–2.2)
Albumin: 4.6 g/dL (ref 4.1–5.1)
Alkaline Phosphatase: 95 IU/L (ref 44–121)
BUN/Creatinine Ratio: 10 (ref 9–20)
BUN: 10 mg/dL (ref 6–24)
Basophils Absolute: 0.1 10*3/uL (ref 0.0–0.2)
Basos: 2 %
Bilirubin Total: 0.7 mg/dL (ref 0.0–1.2)
Calcium: 9.2 mg/dL (ref 8.7–10.2)
Chloride: 102 mmol/L (ref 96–106)
Chol/HDL Ratio: 4.6 ratio (ref 0.0–5.0)
Cholesterol, Total: 188 mg/dL (ref 100–199)
Creatinine, Ser: 0.98 mg/dL (ref 0.76–1.27)
EOS (ABSOLUTE): 0.2 10*3/uL (ref 0.0–0.4)
Eos: 4 %
Estimated CHD Risk: 0.9 times avg. (ref 0.0–1.0)
Free Thyroxine Index: 2.1 (ref 1.2–4.9)
GGT: 24 IU/L (ref 0–65)
Globulin, Total: 2 g/dL (ref 1.5–4.5)
Glucose: 84 mg/dL (ref 70–99)
HDL: 41 mg/dL (ref >39)
Hematocrit: 48.9 % (ref 37.5–51.0)
Hemoglobin: 16.8 g/dL (ref 13.0–17.7)
Immature Grans (Abs): 0 10*3/uL (ref 0.0–0.1)
Immature Granulocytes: 0 %
Iron: 109 ug/dL (ref 38–169)
LDH: 149 IU/L (ref 121–224)
LDL Chol Calc (NIH): 124 mg/dL — ABNORMAL HIGH (ref 0–99)
Lymphocytes Absolute: 1.3 10*3/uL (ref 0.7–3.1)
Lymphs: 32 %
MCH: 31.7 pg (ref 26.6–33.0)
MCHC: 34.4 g/dL (ref 31.5–35.7)
MCV: 92 fL (ref 79–97)
Monocytes Absolute: 0.4 10*3/uL (ref 0.1–0.9)
Monocytes: 10 %
Neutrophils Absolute: 2.1 10*3/uL (ref 1.4–7.0)
Neutrophils: 52 %
Phosphorus: 2.9 mg/dL (ref 2.8–4.1)
Platelets: 285 10*3/uL (ref 150–450)
Potassium: 4.5 mmol/L (ref 3.5–5.2)
Prostate Specific Ag, Serum: 0.4 ng/mL (ref 0.0–4.0)
RBC: 5.3 x10E6/uL (ref 4.14–5.80)
RDW: 12.6 % (ref 11.6–15.4)
Sodium: 138 mmol/L (ref 134–144)
T3 Uptake Ratio: 29 % (ref 24–39)
T4, Total: 7.1 ug/dL (ref 4.5–12.0)
TSH: 2.98 u[IU]/mL (ref 0.450–4.500)
Total Protein: 6.6 g/dL (ref 6.0–8.5)
Triglycerides: 130 mg/dL (ref 0–149)
Uric Acid: 8 mg/dL (ref 3.8–8.4)
VLDL Cholesterol Cal: 23 mg/dL (ref 5–40)
WBC: 4 10*3/uL (ref 3.4–10.8)
eGFR: 98 mL/min/{1.73_m2} (ref >59)

## 2021-12-26 ENCOUNTER — Encounter: Payer: Self-pay | Admitting: Physician Assistant

## 2021-12-26 ENCOUNTER — Ambulatory Visit: Payer: 59 | Admitting: Physician Assistant

## 2021-12-26 VITALS — BP 140/90 | HR 77 | Temp 97.6°F | Resp 14 | Ht 69.0 in | Wt 210.0 lb

## 2021-12-26 DIAGNOSIS — Z0000 Encounter for general adult medical examination without abnormal findings: Secondary | ICD-10-CM

## 2021-12-26 MED ORDER — AMLODIPINE BESYLATE 10 MG PO TABS: 10.0000 mg | ORAL_TABLET | Freq: Every day | ORAL | 3 refills | Status: DC

## 2021-12-26 NOTE — Progress Notes (Signed)
[facility] ____________________________________________   None    (approximate)  I have reviewed the triage vital signs and the nursing notes.   HISTORY  Chief Complaint Employment Physical   HPI David Weber is a 46 y.o. 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  Patient voiced no concerns or complaints.         Past Medical History:  Diagnosis Date   DDD (degenerative disc disease), cervical    Elevated lipids    Elevated PSA    Neuropathy of hand, left    Overweight     Patient Active Problem List   Diagnosis Date Noted   Paresthesias in left hand 07/16/2019   BMI 31.0-31.9,adult 07/16/2019   Hyperlipidemia 10/03/2018    Past Surgical History:  Procedure Laterality Date   CHOLECYSTECTOMY  2007   HAND SURGERY  2008    Prior to Admission medications   Medication Sig Start Date End Date Taking? Authorizing Provider  amLODipine (NORVASC) 10 MG tablet Take 1 tablet (10 mg total) by mouth daily. 12/13/20  Yes Sable Feil, PA-C  fexofenadine-pseudoephedrine (ALLEGRA-D) 60-120 MG 12 hr tablet Take 1 tablet by mouth 2 (two) times daily. 12/07/20  Yes Sable Feil, PA-C  orphenadrine (NORFLEX) 100 MG tablet Take 1 tablet (100 mg total) by mouth 2 (two) times daily. 12/07/20  Yes Sable Feil, PA-C  sildenafil (VIAGRA) 100 MG tablet Take 1 tablet (100 mg total) by mouth daily as needed for erectile dysfunction. 01/05/21  Yes Sable Feil, PA-C  simvastatin (ZOCOR) 40 MG tablet Take 1 tablet (40 mg total) by mouth at bedtime. 01/21/20  Yes Sable Feil, PA-C  zolpidem (AMBIEN) 5 MG tablet Take 1 tablet (5 mg total) by mouth at bedtime as needed for sleep. 12/07/20  Yes Sable Feil, PA-C    Allergies Patient has no known allergies.  Family History  Problem Relation Age of Onset   Melanoma Mother    Colon polyps Mother    Diabetes Father    Colon polyps Father    Arrhythmia Father     Social  History Social History   Tobacco Use   Smoking status: Never   Smokeless tobacco: Current    Types: Chew  Substance Use Topics   Alcohol use: Yes    Alcohol/week: 4.0 standard drinks of alcohol    Types: 4 Cans of beer per week   Drug use: Never    Review of Systems Constitutional: No fever/chills Eyes: No visual changes. ENT: No sore throat. Cardiovascular: Denies chest pain. Respiratory: Denies shortness of breath. Gastrointestinal: No abdominal pain.  No nausea, no vomiting.  No diarrhea.  No constipation. Genitourinary: Negative for dysuria.  Erectile dysfunction Musculoskeletal: Negative for back pain. Skin: Negative for rash. Neurological: Negative for headaches, focal weakness or numbness.  Insomnia Endocrine: Hyperlipidemia and hypertension  ____________________________________________   PHYSICAL EXAM:  VITAL SIGNS: BP is 140/90, pulse 77, respiration 14, temperature 97.6, patient 96 and O2 sat on room air.  Patient weighs 210 pounds BMI is 31.01. Constitutional: Alert and oriented. Well appearing and in no acute distress. Eyes: Conjunctivae are normal. PERRL. EOMI. Head: Atraumatic. Nose: No congestion/rhinnorhea. Mouth/Throat: Mucous membranes are moist.  Oropharynx non-erythematous. Neck: No stridor.  No cervical spine tenderness to palpation. Hematological/Lymphatic/Immunilogical: No cervical lymphadenopathy. Cardiovascular: Normal rate, regular rhythm. Grossly normal heart sounds.  Good peripheral circulation. Respiratory: Normal respiratory effort.  No retractions. Lungs CTAB. Gastrointestinal: Soft and nontender. No distention. No  abdominal bruits. No CVA tenderness. Genitourinary: Not if is over 140/90 okay Hello, I just refilled this medication sent to the pharmacy Musculoskeletal: No lower extremity tenderness nor edema.  No joint effusions. Neurologic:  Normal speech and language. No gross focal neurologic deficits are appreciated. No gait  instability. Skin:  Skin is warm, dry and intact. No rash noted. Psychiatric: Mood and affect are normal. Speech and behavior are normal.  ____________________________________________   LABS  __           Component Ref Range & Units 5 d ago (12/21/21) 11 mo ago (01/10/21) 1 yr ago (12/30/19) 2 yr ago (02/26/19) 3 yr ago (09/30/18) 3 yr ago (03/07/18) 4 yr ago (03/06/17)  Glucose 70 - 99 mg/dL 84  102 High  CM  99 R  90 R      Uric Acid 3.8 - 8.4 mg/dL 8.0  8.1 CM  7.7 CM  7.3 R, CM      Comment:            Therapeutic target for gout patients: <6.0  BUN 6 - 24 mg/dL _0 Creatinine, Ser 0.76 - 1.27 mg/dL 0.98  0.93  0.93  0.94      eGFR >59 mL/min/1.73 98  104        BUN/Creatinine Ratio 9 - _1 Sodium 134 - 144 mmol/L 138  139  137  139      Potassium 3.5 - 5.2 mmol/L 4.5  4.0  4.4  4.6      Chloride 96 - 106 mmol/L 102  102  102  103      Calcium 8.7 - 10.2 mg/dL 9.2  9.1  9.4  9.7      Phosphorus 2.8 - 4.1 mg/dL 2.9  2.4 Low   2.5 Low   2.8      Total Protein 6.0 - 8.5 g/dL 6.6  6.6  6.6  6.8      Albumin 4.1 - 5.1 g/dL 4.6  4.6 R  4.6 R  4.9 R      Globulin, Total 1.5 - 4.5 g/dL 2.0  2.0  2.0  1.9      Albumin/Globulin Ratio 1.2 - 2.2 2.3 High   2.3 High   2.3 High   2.6 High       Bilirubin Total 0.0 - 1.2 mg/dL 0.7  0.7  0.9  0.6      Alkaline Phosphatase 44 - 121 IU/L 95  95  82 CM  86 R      LDH 121 - 224 IU/L 149  160  144  160      AST 0 - 40 IU/L _2 ALT 0 - 44 IU/L 24  35  29  35      GGT 0 - 65 IU/L 24  39  30  37      Iron 38 - 169 ug/dL 109  125  142  111      Cholesterol, Total 100 - 199 mg/dL 188  229 High   232 High   231 High   220 High      Triglycerides 0 - 149 mg/dL 130  153 High   152 High   164 High   106  157 R  208 Abnormal  R  HDL >39 mg/dL 41  42  39 Low   46  43  45 R  37 R   VLDL Cholesterol Cal 5 - 40 mg/dL _0 LDL Chol Calc (NIH) 0 - 99 mg/dL 124 High   159 High   165 High   155  High       Chol/HDL Ratio 0.0 - 5.0 ratio 4.6  5.5 High  CM  5.9 High  CM  5.0 CM  5.1 High  CM     Comment:                                   T. Chol/HDL Ratio                                              Men  Women                                1/2 Avg.Risk  3.4    3.3                                    Avg.Risk  5.0    4.4                                 2X Avg.Risk  9.6    7.1                                 3X Avg.Risk 23.4   11.0   Estimated CHD Risk 0.0 - 1.0 times avg. 0.9  1.1 High  CM  1.3 High  CM  1.0 CM      Comment: The CHD Risk is based on the T. Chol/HDL ratio. Other  factors affect CHD Risk such as hypertension, smoking,  diabetes, severe obesity, and family history of  premature CHD.   TSH 0.450 - 4.500 uIU/mL 2.980  2.480  4.150  3.880      T4, Total 4.5 - 12.0 ug/dL 7.1  7.8  6.6  8.0      T3 Uptake Ratio 24 - 39 % _1 Low   26      Free Thyroxine Index 1.2 - 4.9 2.1  2.1  1.5  2.1      Prostate Specific Ag, Serum 0.0 - 4.0 ng/mL 0.4  0.4 CM  0.4 CM  0.4 CM      Comment: Roche ECLIA methodology.  According to the American Urological Association, Serum PSA should  decrease and remain at undetectable levels after radical  prostatectomy. The AUA defines biochemical recurrence as an initial  PSA value 0.2 ng/mL or greater followed by a subsequent confirmatory  PSA value 0.2 ng/mL or greater.  Values obtained with different assay methods or kits cannot be used  interchangeably. Results cannot be interpreted as absolute evidence  of the presence or absence of malignant disease.   WBC 3.4 - 10.8 x10E3/uL 4.0  4.9  5.1  5.3      RBC 4.14 - 5.80 x10E6/uL 5.30  4.92  5.07  4.97      Hemoglobin 13.0 - 17.7 g/dL 16.8  15.3  16.1  15.9      Hematocrit 37.5 - 51.0 % 48.9  44.9  46.5  44.6      MCV 79 - 97 fL 92  91  92  90      MCH 26.6 - 33.0 pg 31.7  31.1  31.8  32.0      MCHC 31.5 - 35.7 g/dL 34.4  34.1  34.6  35.7      RDW 11.6 - 15.4 % 12.6  12.2  12.4  12.4       Platelets 150 - 450 x10E3/uL 285  261  281  287      Neutrophils Not Estab. % 52  58  62  55      Lymphs Not Estab. % 32  _0 Monocytes Not Estab. % _1 Eos Not Estab. % _2 Basos Not Estab. % _3 Neutrophils Absolute 1.4 - 7.0 x10E3/uL 2.1  2.9  3.1  2.9      Lymphocytes Absolute 0.7 - 3.1 x10E3/uL 1.3  1.3  1.3  1.6      Monocytes Absolute 0.1 - 0.9 x10E3/uL 0.4  0.4  0.5  0.5      EOS (ABSOLUTE) 0.0 - 0.4 x10E3/uL 0.2  0.2  0.1  0.2      Basophils Absolute 0.0 - 0.2 x10E3/uL 0.1  0.1  0.1  0.1      Immature Granulocytes Not Estab. % 0  0  0  0      Immature Grans             __________________________________________  EKG Sinus rhythm at 62 bpm.  Right bundle branch block and left Bifascicular block.   ____________________________________________    ____________________________________________   INITIAL IMPRESSION / ASSESSMENT AND PLAN  As part of my medical decision making, I reviewed the following data within the electronic MEDICAL RECORD NUMBER      Discussed lab results and EKG findings with patient        ____________________________________________   FINAL CLINICAL IMPRESSION(S) / ED DIAGNOSES  Well exam   ED Discharge Orders     None        Note:  This document was prepared using Dragon voice recognition software and may include unintentional dictation errors.

## 2021-12-26 NOTE — Progress Notes (Signed)
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 Pt denies any issues or concerns at this time./CL,RMA

## 2022-03-22 ENCOUNTER — Other Ambulatory Visit: Payer: Self-pay | Admitting: Physician Assistant

## 2022-03-22 DIAGNOSIS — N528 Other male erectile dysfunction: Secondary | ICD-10-CM

## 2022-08-29 ENCOUNTER — Ambulatory Visit: Payer: Self-pay | Admitting: Physician Assistant

## 2022-08-29 ENCOUNTER — Encounter: Payer: Self-pay | Admitting: Physician Assistant

## 2022-08-29 VITALS — BP 140/98 | HR 82 | Resp 16 | Wt 210.0 lb

## 2022-08-29 DIAGNOSIS — K640 First degree hemorrhoids: Secondary | ICD-10-CM

## 2022-08-29 MED ORDER — DOCUSATE SODIUM 100 MG PO CAPS: 100.0000 mg | ORAL_CAPSULE | Freq: Two times a day (BID) | ORAL | 0 refills | Status: DC

## 2022-08-29 MED ORDER — HYDROCORTISONE ACETATE 25 MG RE SUPP: 25.0000 mg | Freq: Two times a day (BID) | RECTAL | 0 refills | Status: DC

## 2022-08-29 NOTE — Progress Notes (Signed)
Pt presents today with painful hemorrhoids for about 8 months. Pt had diarreha at 2am this morning and has been in pain/ discomfort since.

## 2022-08-29 NOTE — Progress Notes (Signed)
   Subjective: Rectal hemorrhoids    Patient ID: David Weber, male    DOB: 23-May-1975, 47 y.o.   MRN: [id number]  HPI Patient complain of mild rectal pain status post constipated bowel movement 2 days ago.  Patient noticed scant blood on tissue paper.  Patient has a history of rectal hemorrhoids with flares up approximately 2-3 times a year.  Patient states after this episode he performed grass cutting using a riding lawnmower for approximately 4 hours.  States he is uncomfortable with sitting down.  Past medical history multiple gallbladder surgery.   Review of Systems Hyperlipidemia    Objective:   Physical Exam See nurses note for vital signs. Rectal exam shows small flesh-colored hemorrhoidal tissue at the 6 o'clock position.  Patient was guaiac negative.       Assessment & Plan: Rectal hemorrhoid  Patient given prescription for Anusol and Colace.  Patient advised follow-up 1 week if there is no improvement or worsening complaints.  Patient advised to increase fiber in diet.

## 2022-12-06 ENCOUNTER — Encounter: Payer: Self-pay | Admitting: Physician Assistant

## 2022-12-06 ENCOUNTER — Ambulatory Visit: Payer: Self-pay | Admitting: Physician Assistant

## 2022-12-06 VITALS — BP 130/90 | HR 86 | Temp 97.5°F | Resp 12 | Ht 69.0 in | Wt 218.0 lb

## 2022-12-06 DIAGNOSIS — R5383 Other fatigue: Secondary | ICD-10-CM

## 2022-12-06 DIAGNOSIS — M542 Cervicalgia: Secondary | ICD-10-CM

## 2022-12-06 NOTE — Progress Notes (Signed)
   Subjective: Increased blood pressure    Patient ID: David Weber, male    DOB: 04/03/1976, 47 y.o.   MRN: [id number]  HPI Patient comes in for evaluation of increased blood pressure.  Patient stated his home unit is showing increased blood pressure.  Patient currently takes Norvasc 10 mg every morning.  Patient relates increased family stress trying to place grandmother nursing home.  Patient also noticed increased weight with increased appetite.  Patient states decreased activities and increased fatigue.  Patient also complaining of radicular neck pain to right upper extremity.   Review of Systems Erectile dysfunction, hyperlipidemia, and hypertension.    Objective:   Physical Exam     BP 137/93 130/90  BP Location Left Arm Left Arm  Patient Position Sitting Sitting  Cuff Size Large Large  Pulse 86   Resp 12   Temp 97.5 F (36.4 C)   Temp src Temporal   SpO2 98 %   Weight 218 lb (98.9 kg)   Height 5\' 9"  (1.753 m)    Physical exam was deferred since patient is due for physical in the next 3 days.       Assessment & Plan: Subjective hypertension secondary to stress  Recommend 3-day blood pressure check.  Executive blood panel to include testosterone level, vitamin B level, and vitamin D levels.  Patient also sent for cervical spine x-ray.  Will follow-up on September 10 with physical exam.

## 2022-12-06 NOTE — Progress Notes (Signed)
Presents to [facility] with C/O elevated blood pressure. States he's been under some family stress and feeling the effects of feeling like blood pressure is up.  States last few days have been rough.  Takes Amlodipine 10 mg & takes it in the morning.  This morning checked BP 136/88 right arm & 142/92 left arm. Checked BP about 20-30 minutes after taking medication. Chewing tobacco about the same time  Decreased physical activity. States eats more.  States I eat like it's the last supper.  AMD

## 2022-12-07 ENCOUNTER — Ambulatory Visit: Payer: Self-pay

## 2022-12-07 VITALS — BP 120/82 | HR 71

## 2022-12-07 DIAGNOSIS — R5383 Other fatigue: Secondary | ICD-10-CM

## 2022-12-07 DIAGNOSIS — Z0289 Encounter for other administrative examinations: Secondary | ICD-10-CM

## 2022-12-07 LAB — POCT URINALYSIS DIPSTICK
Bilirubin, UA: NEGATIVE
Blood, UA: NEGATIVE
Glucose, UA: NEGATIVE
Ketones, UA: NEGATIVE
Leukocytes, UA: NEGATIVE
Nitrite, UA: NEGATIVE
Protein, UA: NEGATIVE
Spec Grav, UA: <=1.005 — AB (ref 1.010–1.025)
Urobilinogen, UA: 0.2 U/dL
pH, UA: 6 (ref 5.0–8.0)

## 2022-12-07 NOTE — Progress Notes (Signed)
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 Pt BP read 119/79

## 2022-12-10 LAB — CMP12+LP+TP+TSH+6AC+PSA+CBC…
ALT: 56 IU/L — ABNORMAL HIGH (ref 0–44)
AST: 32 IU/L (ref 0–40)
Albumin: 4.7 g/dL (ref 4.1–5.1)
Alkaline Phosphatase: 80 IU/L (ref 44–121)
BUN/Creatinine Ratio: 12 (ref 9–20)
BUN: 9 mg/dL (ref 6–24)
Basophils Absolute: 0.1 10*3/uL (ref 0.0–0.2)
Basos: 2 %
Bilirubin Total: 0.6 mg/dL (ref 0.0–1.2)
Calcium: 9.5 mg/dL (ref 8.7–10.2)
Chloride: 99 mmol/L (ref 96–106)
Chol/HDL Ratio: 6.6 ratio — ABNORMAL HIGH (ref 0.0–5.0)
Cholesterol, Total: 264 mg/dL — ABNORMAL HIGH (ref 100–199)
Creatinine, Ser: 0.78 mg/dL (ref 0.76–1.27)
EOS (ABSOLUTE): 0.2 10*3/uL (ref 0.0–0.4)
Eos: 4 %
Estimated CHD Risk: 1.4 times avg. — ABNORMAL HIGH (ref 0.0–1.0)
Free Thyroxine Index: 2 (ref 1.2–4.9)
GGT: 61 IU/L (ref 0–65)
Globulin, Total: 2 g/dL (ref 1.5–4.5)
Glucose: 94 mg/dL (ref 70–99)
HDL: 40 mg/dL (ref >39)
Hematocrit: 45.1 % (ref 37.5–51.0)
Hemoglobin: 15.4 g/dL (ref 13.0–17.7)
Immature Grans (Abs): 0 10*3/uL (ref 0.0–0.1)
Immature Granulocytes: 0 %
Iron: 132 ug/dL (ref 38–169)
LDH: 157 IU/L (ref 121–224)
LDL Chol Calc (NIH): 179 mg/dL — ABNORMAL HIGH (ref 0–99)
Lymphocytes Absolute: 1.3 10*3/uL (ref 0.7–3.1)
Lymphs: 32 %
MCH: 31 pg (ref 26.6–33.0)
MCHC: 34.1 g/dL (ref 31.5–35.7)
MCV: 91 fL (ref 79–97)
Monocytes Absolute: 0.5 10*3/uL (ref 0.1–0.9)
Monocytes: 12 %
Neutrophils Absolute: 2 10*3/uL (ref 1.4–7.0)
Neutrophils: 50 %
Phosphorus: 2.7 mg/dL — ABNORMAL LOW (ref 2.8–4.1)
Platelets: 263 10*3/uL (ref 150–450)
Potassium: 4.5 mmol/L (ref 3.5–5.2)
Prostate Specific Ag, Serum: 0.4 ng/mL (ref 0.0–4.0)
RBC: 4.97 x10E6/uL (ref 4.14–5.80)
RDW: 11.9 % (ref 11.6–15.4)
Sodium: 137 mmol/L (ref 134–144)
T3 Uptake Ratio: 26 % (ref 24–39)
T4, Total: 7.7 ug/dL (ref 4.5–12.0)
TSH: 2.55 u[IU]/mL (ref 0.450–4.500)
Total Protein: 6.7 g/dL (ref 6.0–8.5)
Triglycerides: 235 mg/dL — ABNORMAL HIGH (ref 0–149)
Uric Acid: 8 mg/dL (ref 3.8–8.4)
VLDL Cholesterol Cal: 45 mg/dL — ABNORMAL HIGH (ref 5–40)
WBC: 4 10*3/uL (ref 3.4–10.8)
eGFR: 112 mL/min/{1.73_m2} (ref >59)

## 2022-12-10 LAB — TESTOSTERONE,FREE AND TOTAL
Testosterone, Free: 7.7 pg/mL (ref 6.8–21.5)
Testosterone: 383 ng/dL (ref 264–916)

## 2022-12-10 LAB — VITAMIN D 25 HYDROXY (VIT D DEFICIENCY, FRACTURES): Vit D, 25-Hydroxy: 31 ng/mL (ref 30.0–100.0)

## 2022-12-10 LAB — VITAMIN B12: Vitamin B-12: 597 pg/mL (ref 232–1245)

## 2022-12-12 ENCOUNTER — Encounter: Payer: Self-pay | Admitting: Physician Assistant

## 2022-12-12 ENCOUNTER — Ambulatory Visit: Payer: Self-pay | Admitting: Physician Assistant

## 2022-12-12 VITALS — BP 134/89 | HR 78 | Temp 97.6°F | Resp 14 | Ht 69.0 in | Wt 228.0 lb

## 2022-12-12 DIAGNOSIS — Z0289 Encounter for other administrative examinations: Secondary | ICD-10-CM

## 2022-12-12 MED ORDER — ROSUVASTATIN CALCIUM 40 MG PO TABS: 40.0000 mg | ORAL_TABLET | Freq: Every day | ORAL | 3 refills | Status: DC

## 2022-12-12 MED ORDER — PHENTERMINE HCL 30 MG PO CAPS: 30.0000 mg | ORAL_CAPSULE | ORAL | 0 refills | Status: DC

## 2022-12-12 NOTE — Progress Notes (Signed)
[facility]  ____________________________________________   None    (approximate)  I have reviewed the triage vital signs and the nursing notes.   HISTORY  Chief Complaint Annual Exam   HPI David Weber is a 47 y.o. 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  Patient was concern for weight gain.  Patient has been noncompliant with statin for lipid control.         Past Medical History:  Diagnosis Date   DDD (degenerative disc disease), cervical    Elevated lipids    Elevated PSA    Neuropathy of hand, left    Overweight     Patient Active Problem List   Diagnosis Date Noted   Paresthesias in left hand 07/16/2019   BMI 31.0-31.9,adult 07/16/2019   Hyperlipidemia 10/03/2018    Past Surgical History:  Procedure Laterality Date   CHOLECYSTECTOMY  2007   HAND SURGERY  2008    Prior to Admission medications   Medication Sig Start Date End Date Taking? Authorizing Provider  amLODipine (NORVASC) 10 MG tablet Take 1 tablet (10 mg total) by mouth daily. 12/26/21  Yes Joni Reining, PA-C  phentermine 30 MG capsule Take 1 capsule (30 mg total) by mouth every morning. 12/12/22  Yes Joni Reining, PA-C  sildenafil (VIAGRA) 100 MG tablet TAKE 1 TABLET BY MOUTH ONCE DAILY AS NEEDED FOR ERECTILE DYSFUNCTION 03/22/22  Yes Joni Reining, PA-C  simvastatin (ZOCOR) 40 MG tablet Take 1 tablet (40 mg total) by mouth at bedtime. 01/21/20  Yes Joni Reining, PA-C    Allergies Patient has no known allergies.  Family History  Problem Relation Age of Onset   Melanoma Mother    Colon polyps Mother    Diabetes Father    Colon polyps Father    Arrhythmia Father     Social History Social History   Tobacco Use   Smoking status: Never   Smokeless tobacco: Current    Types: Chew  Substance Use Topics   Alcohol use: Yes    Alcohol/week: 4.0 standard drinks of alcohol    Types: 4 Cans of beer per week   Drug use: Never     Review of Systems Constitutional: No fever/chills Eyes: No visual changes. ENT: No sore throat. Cardiovascular: Denies chest pain. Respiratory: Denies shortness of breath. Gastrointestinal: No abdominal pain.  No nausea, no vomiting.  No diarrhea.  No constipation. Genitourinary: Negative for dysuria. Musculoskeletal: Negative for back pain. Skin: Negative for rash. Neurological: Negative for headaches, focal weakness or numbness. Psychiatric:  Endocrine: Hyperlipidemia  ____________________________________________   PHYSICAL EXAM:  VITAL SIGNS: BP 134/89  Pulse 78  Resp 14  Temp 97.6 F (36.4 C)  Temp src Temporal  SpO2 95 %  Weight 228 lb (103.4 kg)  Height 5\' 9"  (1.753 m)   BMI 33.67 kg/m2  BSA 2.24 m2   Constitutional: Alert and oriented. Well appearing and in no acute distress. Eyes: Conjunctivae are normal. PERRL. EOMI. Head: Atraumatic. Nose: No congestion/rhinnorhea. Mouth/Throat: Mucous membranes are moist.  Oropharynx non-erythematous. Neck: No stridor.  No cervical spine tenderness to palpation. Hematological/Lymphatic/Immunilogical: No cervical lymphadenopathy. Cardiovascular: Normal rate, regular rhythm. Grossly normal heart sounds.  Good peripheral circulation. Respiratory: Normal respiratory effort.  No retractions. Lungs CTAB. Gastrointestinal: Soft and nontender. No distention. No abdominal bruits. No CVA tenderness. Genitourinary: Deferred Musculoskeletal: No lower extremity tenderness nor edema.  No joint effusions. Neurologic:  Normal speech and language. No gross focal neurologic deficits are  appreciated. No gait instability. Skin:  Skin is warm, dry and intact. No rash noted. Psychiatric: Mood and affect are normal. Speech and behavior are normal.  ____________________________________________   LABS        Component Ref Range & Units 5 d ago 1 yr ago 2 yr ago 3 yr ago  Color, UA yellow amber Dark Yellow Yellow  Clarity, UA clear  clear Clear Clear  Glucose, UA Negative Negative Negative Negative Negative  Bilirubin, UA neg negative Negative Negative  Ketones, UA neg negative 1+ Negative  Spec Grav, UA 1.010 - 1.025 <=1.005 Abnormal  1.025 1.020 1.015  Blood, UA neg negative Negative Negative  pH, UA 5.0 - 8.0 6.0 6.0 6.0 6.0  Protein, UA Negative Negative Negative Negative Negative  Urobilinogen, UA 0.2 or 1.0 E.U./dL 0.2 0.2 0.2 0.2  Nitrite, UA neg negative Negative Negative  Leukocytes, UA Negative Negative Negative Negative Negative  Appearance  dark    Odor                  View All Conversations on this Encounter        Result Care Coordination   Patient Communication   Add Comments   Seen Back to Top    Other Results from 12/07/2022  Vitamin B12 Order: [id number] Status: Final result      Visible to patient: Yes (seen)      Next appt: None      Dx: Other fatigue    0 Result Notes      Component Ref Range & Units 5 d ago  Vitamin B-12 232 - 1,245 pg/mL 597  Resulting Agency [facility]         Narrative Performed by: Verdell Carmine Performed at:  [address] [facility] [address] [place], [place]  [id number] Lab Director: Jolene Schimke MD, Phone:  [phone]         View All Conversations on this Encounter        Result Care Coordination   Patient Communication   Add Comments   Seen Back to Top      VITAMIN D 25 Hydroxy (Vit-D Deficiency, Fractures) Order: [id number]      Component Ref Range & Units 5 d ago  Vit D, 25-Hydroxy 30.0 - 100.0 ng/mL 31.0  Comment: Vitamin D deficiency has been defined by the Institute of Medicine and an Endocrine Society practice guideline as a level of serum 25-OH vitamin D less than 20 ng/mL (1,2). The Endocrine Society went on to further define vitamin D insufficiency as a level between 21 and 29 ng/mL (2). 1. IOM (Institute of Medicine). 2010. Dietary reference    intakes for calcium and D. [place]: The     [employer]. 2. Holick MF, Binkley [place], Bischoff-Ferrari HA, et al.    Evaluation, treatment, and prevention of vitamin D    deficiency: an Endocrine Society clinical practice    guideline. JCEM. 2011 Jul; 96(7):1911-30.  Resulting Agency [facility]         Narrative Performed by: Verdell Carmine Performed at:  [address] [place], [place]  [id number] Lab Director: Jolene Schimke MD, Phone:  [phone]         View All Conversations on this Encounter             Candelaria Celeste and Total Order: [id number] Status: Final result      Visible to patient: Yes (seen)      Next appt: None  Dx: Other fatigue    0 Result Notes      Component Ref Range & Units 5 d ago  Testosterone 264 - 916 ng/dL 161  Comment: Adult male reference interval is based on a population of healthy nonobese males (BMI <30) between 73 and 45 years old. Travison, et.al. JCEM [phone]. PMID: [id number].  Testosterone, Free 6.8 - 21.5 pg/mL 7.7  Resulting Agency [facility]         Narrative Performed by: Verdell Carmine Performed at:  [address] - [facility] [address], [place], [place]  [id number] Lab Director: Jolene Schimke MD, Phone:  [phone]         View All Conversations on this Encounter                         Component Ref Range & Units 5 d ago (12/07/22) 11 mo ago (12/21/21) 1 yr ago (01/10/21) 2 yr ago (12/30/19) 3 yr ago (02/26/19) 4 yr ago (09/30/18) 4 yr ago (03/07/18)  Glucose 70 - 99 mg/dL 94 84 244 High  CM 99 R 90 R    Uric Acid 3.8 - 8.4 mg/dL 8.0 8.0 CM 8.1 CM 7.7 CM 7.3 R, CM    Comment:            Therapeutic target for gout patients: <6.0  BUN 6 - 24 mg/dL 9 10 8 10 10     Creatinine, Ser 0.76 - 1.27 mg/dL 0.10 2.72 5.36 6.44 0.34    eGFR >59 mL/min/1.73 112 98 104      BUN/Creatinine Ratio 9 - 20 12 10 9 11 11     Sodium 134 - 144 mmol/L 137 138 139 137 139    Potassium 3.5 - 5.2 mmol/L 4.5 4.5 4.0 4.4 4.6     Chloride 96 - 106 mmol/L 99 102 102 102 103    Calcium 8.7 - 10.2 mg/dL 9.5 9.2 9.1 9.4 9.7    Phosphorus 2.8 - 4.1 mg/dL 2.7 Low  2.9 2.4 Low  2.5 Low  2.8    Total Protein 6.0 - 8.5 g/dL 6.7 6.6 6.6 6.6 6.8    Albumin 4.1 - 5.1 g/dL 4.7 4.6 4.6 R 4.6 R 4.9 R    Globulin, Total 1.5 - 4.5 g/dL 2.0 2.0 2.0 2.0 1.9    Bilirubin Total 0.0 - 1.2 mg/dL 0.6 0.7 0.7 0.9 0.6    Alkaline Phosphatase 44 - 121 IU/L 80 95 95 82 CM 86 R    LDH 121 - 224 IU/L 157 149 160 144 160    AST 0 - 40 IU/L 32 17 20 18 21     ALT 0 - 44 IU/L 56 High  24 35 29 35    GGT 0 - 65 IU/L 61 24 39 30 37    Iron 38 - 169 ug/dL 742 595 638 756 433    Cholesterol, Total 100 - 199 mg/dL 295 High  188 416 High  232 High  231 High  220 High    Triglycerides 0 - 149 mg/dL 606 High  301 601 High  152 High  164 High  106 157 R  HDL >39 mg/dL 40 41 42 39 Low  46 43 45 R  VLDL Cholesterol Cal 5 - 40 mg/dL 45 High  23 28 28 30 21    LDL Chol Calc (NIH) 0 - 99 mg/dL 093 High  235 High  573 High  165 High  155 High     Chol/HDL  Ratio 0.0 - 5.0 ratio 6.6 High  4.6 CM 5.5 High  CM 5.9 High  CM 5.0 CM 5.1 High  CM   Comment:                                   T. Chol/HDL Ratio                                             Men  Women                               1/2 Avg.Risk  3.4    3.3                                   Avg.Risk  5.0    4.4                                2X Avg.Risk  9.6    7.1                                3X Avg.Risk 23.4   11.0  Estimated CHD Risk 0.0 - 1.0 times avg. 1.4 High  0.9 CM 1.1 High  CM 1.3 High  CM 1.0 CM    Comment: The CHD Risk is based on the T. Chol/HDL ratio. Other factors affect CHD Risk such as hypertension, smoking, diabetes, severe obesity, and family history of premature CHD.  TSH 0.450 - 4.500 uIU/mL 2.550 2.980 2.480 4.150 3.880    T4, Total 4.5 - 12.0 ug/dL 7.7 7.1 7.8 6.6 8.0    T3 Uptake Ratio 24 - 39 % 26 29 27 22  Low  26    Free Thyroxine Index 1.2 - 4.9 2.0  2.1 2.1 1.5 2.1    Prostate Specific Ag, Serum 0.0 - 4.0 ng/mL 0.4 0.4 CM 0.4 CM 0.4 CM 0.4 CM    Comment: Roche ECLIA methodology. According to the American Urological Association, Serum PSA should decrease and remain at undetectable levels after radical prostatectomy. The AUA defines biochemical recurrence as an initial PSA value 0.2 ng/mL or greater followed by a subsequent confirmatory PSA value 0.2 ng/mL or greater. Values obtained with different assay methods or kits cannot be used interchangeably. Results cannot be interpreted as absolute evidence of the presence or absence of malignant disease.  WBC 3.4 - 10.8 x10E3/uL 4.0 4.0 4.9 5.1 5.3    RBC 4.14 - 5.80 x10E6/uL 4.97 5.30 4.92 5.07 4.97    Hemoglobin 13.0 - 17.7 g/dL 29.5 62.1 30.8 65.7 84.6    Hematocrit 37.5 - 51.0 % 45.1 48.9 44.9 46.5 44.6    MCV 79 - 97 fL 91 92 91 92 90    MCH 26.6 - 33.0 pg 31.0 31.7 31.1 31.8 32.0    MCHC 31.5 - 35.7 g/dL 96.2 95.2 84.1 32.4 40.1    RDW 11.6 - 15.4 % 11.9 12.6 12.2 12.4 12.4    Platelets 150 - 450 x10E3/uL 263 285 261 281 287    Neutrophils Not Estab. % 50 52  58 62 55    Lymphs Not Estab. % 32 32 27 25 30     Monocytes Not Estab. % 12 10 9 9 9     Eos Not Estab. % 4 4 5 3 4     Basos Not Estab. % 2 2 1 1 2     Neutrophils Absolute 1.4 - 7.0 x10E3/uL 2.0 2.1 2.9 3.1 2.9    Lymphocytes Absolute 0.7 - 3.1 x10E3/uL 1.3 1.3 1.3 1.3 1.6    Monocytes Absolute 0.1 - 0.9 x10E3/uL 0.5 0.4 0.4 0.5 0.5    EOS (ABSOLUTE) 0.0 - 0.4 x10E3/uL 0.2 0.2 0.2 0.1 0.2    Basophils Absolute 0.0 - 0.2 x10E3/uL 0.1 0.1 0.1 0.1 0.1    Immature Granulocytes Not Estab. % 0 0 0 0 0    Immature Grans (Abs)             ____________________________________________  EKG  Sinus rhythm at 65 bpm.  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 ____________________________________________    ____________________________________________   INITIAL IMPRESSION / ASSESSMENT AND  PLAN  As part of my medical decision making, I reviewed the following data within the electronic MEDICAL RECORD NUMBER      No acute final physical exam.  Labs reveal mixed hyperlipidemia.  Patient will start phentermine with a weight goal of 190 pounds.     ____________________________________________   FINAL CLINICAL IMPRESSION Well exam.   ED Discharge Orders          Ordered    phentermine 30 MG capsule  BH-each morning        12/12/22 0905             Note:  This document was prepared using Dragon voice recognition software and may include unintentional dictation errors.

## 2022-12-12 NOTE — Progress Notes (Signed)
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 CL,RMA

## 2023-01-10 ENCOUNTER — Encounter: Payer: Self-pay | Admitting: Physician Assistant

## 2023-01-10 ENCOUNTER — Ambulatory Visit: Payer: Self-pay | Admitting: Physician Assistant

## 2023-01-10 VITALS — BP 131/81 | HR 70 | Temp 97.9°F | Resp 16 | Ht 69.0 in | Wt 222.0 lb

## 2023-01-10 DIAGNOSIS — Z7689 Persons encountering health services in other specified circumstances: Secondary | ICD-10-CM

## 2023-01-10 NOTE — Progress Notes (Signed)
Follow up with Phentermine and denies side effects with noted weight loss.  Request to continue taking meds and recently started exercising.

## 2023-01-10 NOTE — Progress Notes (Signed)
  Subjective:     Patient ID: David Weber, male    DOB: July 21, 1975, 47 y.o.   MRN: [id number]  No chief complaint on file.   HPI Patient presents for follow up on weight loss and phentermine usage. Reports increasing physical activity through work outs at a [employer]. Denies side/adverse effects.   Review of Systems  All other systems reviewed and are negative.     Objective:    BP 131/81   Pulse 70   Temp 97.9 F (36.6 C)   Resp 16   Ht 5\' 9"  (1.753 m)   Wt 222 lb (100.7 kg)   SpO2 95%   BMI 32.78 kg/m    Physical Exam Constitutional:      Appearance: He is not ill-appearing.  Cardiovascular:     Rate and Rhythm: Normal rate and regular rhythm.  Pulmonary:     Effort: Pulmonary effort is normal.     Breath sounds: Normal breath sounds.  Musculoskeletal:        General: Normal range of motion.  Skin:    General: Skin is warm and dry.     Capillary Refill: Capillary refill takes less than 2 seconds.  Neurological:     General: No focal deficit present.     Mental Status: He is alert. Mental status is at baseline.  Psychiatric:        Mood and Affect: Mood normal.        Behavior: Behavior normal.    Filed Weights   01/10/23 0845  Weight: 222 lb (100.7 kg)       Assessment & Plan:   Problem List Items Addressed This Visit   None Visit Diagnoses     Encounter for weight management    -  Primary      Patient tolerating current dosage of phentermine without side or adverse effects. 6 lb weight loss noted from previous visit on 12/12/2022. Phentermine dosage increased to 30 mg.  Patient advised to contact this office with any concerns or needs.   Delma Post, RN

## 2023-01-11 ENCOUNTER — Other Ambulatory Visit: Payer: Self-pay | Admitting: Physician Assistant

## 2023-01-11 DIAGNOSIS — Z7689 Persons encountering health services in other specified circumstances: Secondary | ICD-10-CM

## 2023-01-11 MED ORDER — PHENTERMINE HCL 37.5 MG PO CAPS: 37.5000 mg | ORAL_CAPSULE | ORAL | 0 refills | Status: DC

## 2023-02-09 ENCOUNTER — Other Ambulatory Visit: Payer: Self-pay

## 2023-02-09 ENCOUNTER — Ambulatory Visit: Payer: Self-pay

## 2023-02-09 DIAGNOSIS — Z7689 Persons encountering health services in other specified circumstances: Secondary | ICD-10-CM

## 2023-02-09 MED ORDER — PHENTERMINE HCL 37.5 MG PO CAPS
37.5000 mg | ORAL_CAPSULE | ORAL | 0 refills | Status: DC
Start: 2023-02-09 — End: 2023-03-16

## 2023-02-27 ENCOUNTER — Other Ambulatory Visit: Payer: Self-pay

## 2023-02-27 DIAGNOSIS — N528 Other male erectile dysfunction: Secondary | ICD-10-CM

## 2023-02-27 MED ORDER — SILDENAFIL CITRATE 100 MG PO TABS: 100.0000 mg | ORAL_TABLET | Freq: Every day | ORAL | 11 refills | Status: DC | PRN

## 2023-03-07 ENCOUNTER — Ambulatory Visit: Payer: Self-pay | Admitting: Physician Assistant

## 2023-03-07 ENCOUNTER — Encounter: Payer: Self-pay | Admitting: Physician Assistant

## 2023-03-07 VITALS — BP 145/93 | HR 105 | Temp 98.0°F | Resp 16 | Ht 69.0 in | Wt 212.0 lb

## 2023-03-07 DIAGNOSIS — J09X2 Influenza due to identified novel influenza A virus with other respiratory manifestations: Secondary | ICD-10-CM

## 2023-03-07 DIAGNOSIS — Z1152 Encounter for screening for COVID-19: Secondary | ICD-10-CM

## 2023-03-07 LAB — POCT INFLUENZA A/B
Influenza A, POC: POSITIVE — AB
Influenza B, POC: NEGATIVE

## 2023-03-07 LAB — POC COVID19 BINAXNOW: SARS Coronavirus 2 Ag: NEGATIVE

## 2023-03-07 MED ORDER — BENZONATATE 200 MG PO CAPS: 200.0000 mg | ORAL_CAPSULE | Freq: Two times a day (BID) | ORAL | 0 refills | Status: DC | PRN

## 2023-03-07 MED ORDER — HYDROCOD POLI-CHLORPHE POLI ER 10-8 MG/5ML PO SUER: 5.0000 mL | Freq: Every evening | ORAL | 0 refills | Status: DC | PRN

## 2023-03-07 MED ORDER — NIRMATRELVIR/RITONAVIR (PAXLOVID)TABLET: 3.0000 | ORAL_TABLET | Freq: Two times a day (BID) | ORAL | 0 refills | Status: AC

## 2023-03-07 MED ORDER — IBUPROFEN 800 MG PO TABS: 800.0000 mg | ORAL_TABLET | Freq: Three times a day (TID) | ORAL | 0 refills | Status: DC | PRN

## 2023-03-07 NOTE — Progress Notes (Signed)
Pt presents today  for cough, runny nose, sinus pressure (head hurts), low grade fever last night but fever broke x 2 days. Pt has been taking sudafed once a day and 800mg  ibuprofen once in morning and once at night.

## 2023-03-07 NOTE — Progress Notes (Signed)
   Subjective: Cough and congestion    Patient ID: David Weber, male    DOB: 02/19/1976, 47 y.o.   MRN: [id number]  HPI Patient complain of cough and congestion for 2 days.  Patient also states intermitting fever.  Denies recent travel or known contact with COVID-19.  Patient test positive for influenza in the clinic today.   Review of Systems Hyperlipidemia    Objective:   Physical Exam BP 145/93  Pulse 105  Resp 16  Temp 98 F (36.7 C)  Temp src Oral  SpO2 97 %  Weight 212 lb (96.2 kg)  Height 5\' 9"  (1.753 m)   BMI 31.31 kg/m2  BSA 2.16 m2  Appears malaised HEENT remarkable for postnasal drainage.   Neck is supple for lymphadenopathy or bruits. Lungs are clear to auscultation. Heart is tachycardic.  Positive influenza A      Assessment & Plan: Influenza  Patient given a prescription for Paxlovid, Tessalon Perles, ibuprofen, and Tussionex.  Patient given a work note for 3 days.  Follow-up if condition worsens.

## 2023-03-16 ENCOUNTER — Ambulatory Visit: Payer: Self-pay

## 2023-03-16 ENCOUNTER — Other Ambulatory Visit: Payer: Self-pay

## 2023-03-16 VITALS — Wt 217.0 lb

## 2023-03-16 DIAGNOSIS — Z7689 Persons encountering health services in other specified circumstances: Secondary | ICD-10-CM

## 2023-03-16 MED ORDER — PHENTERMINE HCL 37.5 MG PO CAPS: 37.5000 mg | ORAL_CAPSULE | ORAL | 0 refills | Status: DC

## 2023-03-16 NOTE — Progress Notes (Signed)
Lost 1 lb since 02/2023 visit. Just recovered from influenza  AMD

## 2023-03-16 NOTE — Telephone Encounter (Signed)
Today's weight 217 lbs. Lost 1 lb since 11/21024 visit. Just recovered from influenza  AMD

## 2023-05-08 ENCOUNTER — Encounter: Payer: Self-pay | Admitting: Physician Assistant

## 2023-05-08 ENCOUNTER — Ambulatory Visit: Payer: Self-pay | Admitting: Physician Assistant

## 2023-05-08 VITALS — BP 140/98 | HR 86 | Resp 12 | Ht 69.0 in | Wt 223.0 lb

## 2023-05-08 DIAGNOSIS — Z7689 Persons encountering health services in other specified circumstances: Secondary | ICD-10-CM

## 2023-05-08 NOTE — Progress Notes (Signed)
   Subjective: Weight management    Patient ID: David Weber, male    DOB: 07-10-1975, 48 y.o.   MRN: [id number]  HPI Patient was started on phentermine  for weight management 2 months ago.  Patient did not show up last month for refill of medication.  Admits to noncompliance and requests to restart medication.  Does not give a reason for noncompliance.  Patient has gained 6 pounds since last weigh-in 2024.  Review of Systems Hyperlipidemia    Objective:   Physical Exam BP 140/98BP. 140/98. Data is abnormal. Taken on 05/08/23 10:49 AM  BP Location Left Arm  Patient Position Sitting  Pulse Rate 86  Weight 223 lb (101.2 kg)  Height 5' 9 (1.753 m)  Resp 12  SpO2 97 %  Exam deferred       Assessment & Plan: Noncompliance weight management  Advised patient we will reconsider restarting phentermine  in 6 months.

## 2023-05-08 NOTE — Progress Notes (Signed)
 Stopped taking Phentermine  - didn't come back after last refill  States ate bad during December & January - holidays  Wants to discuss if he can restart Phentermine   Last weight in clinic was 03/16/23 - 217 lbs   BP on automatic was 143/105 Rechecked with manual cuff - 140/98  States had 30 oz cup of coffee this morning & had chewless tobacco about 10 minutes before coming to clinic

## 2023-05-21 ENCOUNTER — Ambulatory Visit: Payer: Self-pay | Admitting: Physician Assistant

## 2023-05-21 ENCOUNTER — Encounter: Payer: Self-pay | Admitting: Physician Assistant

## 2023-05-21 DIAGNOSIS — J0100 Acute maxillary sinusitis, unspecified: Secondary | ICD-10-CM

## 2023-05-21 MED ORDER — FEXOFENADINE-PSEUDOEPHED ER 60-120 MG PO TB12: 1.0000 | ORAL_TABLET | Freq: Two times a day (BID) | ORAL | 0 refills | Status: DC

## 2023-05-21 MED ORDER — AMOXICILLIN 875 MG PO TABS: 875.0000 mg | ORAL_TABLET | Freq: Two times a day (BID) | ORAL | 0 refills | Status: AC

## 2023-05-21 NOTE — Progress Notes (Signed)
Pt presents today with severe congestion x 2 weeks. Pt states he completely stopped up and in the mornings when he can blow his nose its very thick green mucus. Pt having a hard time sleeping due to having to breathe out his mouth causing his throat to be itchy.

## 2023-05-21 NOTE — Progress Notes (Signed)
   Subjective: Sinus congestion    Patient ID: David Weber, male    DOB: Dec 07, 1975, 48 y.o.   MRN: [id number]  HPI Patient complain of sinus congestion for 2 weeks.  Patient condition worse at night trying to sleep.  Patient stated, mouth breather and increased snoring.  Patient states 2 days ago nasal discharge became greenish in color.  Denies fever chills associated complaint.  Denies recent travel or known contact with COVID-19   Review of Systems Hyperlipidemia    Objective:   Physical Exam HEENT is remarkable edematous nasal turbinates and maxillary guarding but not erythematous. Neck is supple for lymphadenopathy or bruits. Lungs are clear to auscultation. Heart is regular rate and rhythm.       Assessment & Plan: Subacute maxillary sinusitis  Patient given prescription for amoxicillin and Allegra-D.  Advised to follow-up 1 week ago on the worsening complaint

## 2023-06-01 ENCOUNTER — Other Ambulatory Visit: Payer: Self-pay | Admitting: Physician Assistant

## 2023-06-01 DIAGNOSIS — N528 Other male erectile dysfunction: Secondary | ICD-10-CM

## 2023-07-26 ENCOUNTER — Encounter: Payer: Self-pay | Admitting: Physician Assistant

## 2023-07-26 ENCOUNTER — Ambulatory Visit: Payer: Self-pay | Admitting: Physician Assistant

## 2023-07-26 DIAGNOSIS — Z7689 Persons encountering health services in other specified circumstances: Secondary | ICD-10-CM

## 2023-07-26 MED ORDER — AMLODIPINE BESYLATE 10 MG PO TABS: 10.0000 mg | ORAL_TABLET | Freq: Every day | ORAL | 3 refills | Status: DC

## 2023-07-26 MED ORDER — PHENTERMINE HCL 37.5 MG PO CAPS: 37.5000 mg | ORAL_CAPSULE | ORAL | 0 refills | Status: DC

## 2023-07-26 NOTE — Progress Notes (Signed)
 Pt presents today for BP medication. Pt has not taken his BP medication in 2weeks. David Weber

## 2023-07-26 NOTE — Progress Notes (Signed)
   Subjective: Medication refill    Patient ID: David Weber, male    DOB: 23-Feb-1976, 48 y.o.   MRN: [id number]  HPI Patient presents for medication refill amlodipine  for hypertension.  Patient stated been on medication for 2 weeks.  Patient also request to restart phentermine  for weight loss.   Review of Systems Hyperlipidemia and hypertension.    Objective:   Physical Exam BP 147/98BP. 147/98. Data is abnormal. Taken on 07/26/23 10:18 AM  Cuff Size Large  Pulse Rate 76  Weight 228 lb (103.4 kg)  Height 5\' 10"  (1.778 m)  Resp 14  SpO2 97 %  No acute distress. HEENT is unremarkable. Neck is supple for lymphadenopathy or bruits. Lungs clear to auscultation. Heart regular rate and rhythm.       Assessment & Plan: Medication refill  Patient prescription for Norvasc  and phentermine  are refilled.  Patient advised to follow-up for monthly weigh-in's.

## 2023-08-24 ENCOUNTER — Ambulatory Visit: Payer: Self-pay

## 2023-08-24 ENCOUNTER — Other Ambulatory Visit: Payer: Self-pay

## 2023-08-24 DIAGNOSIS — Z7689 Persons encountering health services in other specified circumstances: Secondary | ICD-10-CM

## 2023-08-24 MED ORDER — PHENTERMINE HCL 37.5 MG PO CAPS: 37.5000 mg | ORAL_CAPSULE | ORAL | 0 refills | Status: DC

## 2023-08-24 NOTE — Progress Notes (Signed)
 Weight check done and tolerating Phentermine  well daily and request refill with noted weight loss.

## 2023-09-21 ENCOUNTER — Ambulatory Visit: Payer: Self-pay

## 2023-09-21 ENCOUNTER — Other Ambulatory Visit: Payer: Self-pay | Admitting: Physician Assistant

## 2023-09-21 VITALS — Ht 69.0 in | Wt 214.0 lb

## 2023-09-21 DIAGNOSIS — Z7689 Persons encountering health services in other specified circumstances: Secondary | ICD-10-CM

## 2023-09-21 DIAGNOSIS — R634 Abnormal weight loss: Secondary | ICD-10-CM

## 2023-09-21 DIAGNOSIS — T50905A Adverse effect of unspecified drugs, medicaments and biological substances, initial encounter: Secondary | ICD-10-CM

## 2023-09-21 DIAGNOSIS — N528 Other male erectile dysfunction: Secondary | ICD-10-CM

## 2023-09-21 MED ORDER — PHENTERMINE HCL 37.5 MG PO CAPS: 37.5000 mg | ORAL_CAPSULE | ORAL | 0 refills | Status: DC

## 2023-09-21 NOTE — Progress Notes (Signed)
 Pt weighs in at 214lbs. Pt has lost 5lbs since last visit. [facility] for phen[facility]ine  and sildenafil .

## 2023-10-22 ENCOUNTER — Ambulatory Visit: Payer: Self-pay

## 2023-10-22 ENCOUNTER — Other Ambulatory Visit: Payer: Self-pay

## 2023-10-22 VITALS — BP 128/85 | HR 82 | Resp 14 | Ht 69.0 in | Wt 212.0 lb

## 2023-10-22 DIAGNOSIS — Z0130 Encounter for examination of blood pressure without abnormal findings: Secondary | ICD-10-CM

## 2023-10-22 DIAGNOSIS — Z7689 Persons encountering health services in other specified circumstances: Secondary | ICD-10-CM

## 2023-10-22 MED ORDER — PHENTERMINE HCL 37.5 MG PO CAPS: 37.5000 mg | ORAL_CAPSULE | ORAL | 0 refills | Status: DC

## 2023-10-22 NOTE — Progress Notes (Signed)
 Pt presents today for weight check and BP check.

## 2023-11-01 ENCOUNTER — Encounter: Payer: Self-pay | Admitting: Physician Assistant

## 2023-11-01 ENCOUNTER — Ambulatory Visit: Payer: Self-pay | Admitting: Physician Assistant

## 2023-11-01 VITALS — BP 131/92 | HR 68 | Temp 97.0°F | Resp 16

## 2023-11-01 DIAGNOSIS — H6123 Impacted cerumen, bilateral: Secondary | ICD-10-CM

## 2023-11-01 NOTE — Progress Notes (Signed)
   Subjective: Bilateral hearing loss    Patient ID: David Weber, male    DOB: 08/13/1975, 48 y.o.   MRN: [id number]  HPI Patient complains of onset of bilateral hearing loss left greater than right which started today.  Patient denies URI signs and symptoms.  Patient denies vertigo.  Patient also states he noticed increased pressure on the left eardrum.  Patient admits to using Q-tips in the ear canal.   Review of Systems Hyperlipidemia    Objective:   Physical Exam BP 131/92  BP Location Left Arm  Patient Position Sitting  Cuff Size Large  Pulse Rate 68  Temp 97 F (36.1 C)  Temp Source Temporal  Resp 16  SpO2 97 %  HEENT is remarkable for cerumen left greater than right.  Q-tip fibers are clearly visible in the left ear canal. Neck is supple without lymphadenopathy. Lungs are clear to auscultation. Heart regular rate and rhythm.  Cerumen impaction        Assessment & Plan: Cerumen impaction  Patient given eardrops softener and advised return back in 5 days for reevaluation.

## 2023-11-01 NOTE — Progress Notes (Signed)
 Right Ear - sounds are muffled Started today Denies pain

## 2023-11-12 ENCOUNTER — Other Ambulatory Visit: Payer: Self-pay | Admitting: Physician Assistant

## 2023-11-12 DIAGNOSIS — N528 Other male erectile dysfunction: Secondary | ICD-10-CM

## 2023-11-22 ENCOUNTER — Ambulatory Visit: Payer: Self-pay

## 2023-11-22 DIAGNOSIS — Z7689 Persons encountering health services in other specified circumstances: Secondary | ICD-10-CM

## 2023-11-22 MED ORDER — PHENTERMINE HCL 37.5 MG PO CAPS
37.5000 mg | ORAL_CAPSULE | ORAL | 0 refills | Status: DC
Start: 2023-11-22 — End: 2023-12-26

## 2023-11-22 NOTE — Progress Notes (Signed)
 Weight check: 210lbs previous weight 212.

## 2023-12-06 ENCOUNTER — Encounter: Admitting: Physician Assistant

## 2023-12-07 ENCOUNTER — Ambulatory Visit: Payer: Self-pay

## 2023-12-07 DIAGNOSIS — Z0000 Encounter for general adult medical examination without abnormal findings: Secondary | ICD-10-CM

## 2023-12-07 LAB — POCT URINALYSIS DIPSTICK
Bilirubin, UA: NEGATIVE
Blood, UA: NEGATIVE
Glucose, UA: NEGATIVE
Ketones, UA: NEGATIVE
Leukocytes, UA: NEGATIVE
Nitrite, UA: NEGATIVE
Protein, UA: NEGATIVE
Spec Grav, UA: 1.01 (ref 1.010–1.025)
Urobilinogen, UA: NEGATIVE U/dL — AB
pH, UA: 7 (ref 5.0–8.0)

## 2023-12-08 LAB — CMP12+LP+TP+TSH+6AC+PSA+CBC…
ALT: 33 IU/L (ref 0–44)
AST: 23 IU/L (ref 0–40)
Albumin: 4.7 g/dL (ref 4.1–5.1)
Alkaline Phosphatase: 108 IU/L (ref 44–121)
BUN/Creatinine Ratio: 11 (ref 9–20)
BUN: 10 mg/dL (ref 6–24)
Basophils Absolute: 0.1 x10E3/uL (ref 0.0–0.2)
Basos: 1 %
Bilirubin Total: 1 mg/dL (ref 0.0–1.2)
Calcium: 9.7 mg/dL (ref 8.7–10.2)
Chloride: 98 mmol/L (ref 96–106)
Chol/HDL Ratio: 5.3 ratio — ABNORMAL HIGH (ref 0.0–5.0)
Cholesterol, Total: 243 mg/dL — ABNORMAL HIGH (ref 100–199)
Creatinine, Ser: 0.87 mg/dL (ref 0.76–1.27)
EOS (ABSOLUTE): 0.2 x10E3/uL (ref 0.0–0.4)
Eos: 5 %
Estimated CHD Risk: 1.1 times avg. — ABNORMAL HIGH (ref 0.0–1.0)
Free Thyroxine Index: 2.2 (ref 1.2–4.9)
GGT: 34 IU/L (ref 0–65)
Globulin, Total: 2.4 g/dL (ref 1.5–4.5)
Glucose: 81 mg/dL (ref 70–99)
HDL: 46 mg/dL (ref >39)
Hematocrit: 50.4 % (ref 37.5–51.0)
Hemoglobin: 17.2 g/dL (ref 13.0–17.7)
Immature Grans (Abs): 0 x10E3/uL (ref 0.0–0.1)
Immature Granulocytes: 0 %
Iron: 187 ug/dL — ABNORMAL HIGH (ref 38–169)
LDH: 175 IU/L (ref 121–224)
LDL Chol Calc (NIH): 169 mg/dL — ABNORMAL HIGH (ref 0–99)
Lymphocytes Absolute: 1.4 x10E3/uL (ref 0.7–3.1)
Lymphs: 31 %
MCH: 32.3 pg (ref 26.6–33.0)
MCHC: 34.1 g/dL (ref 31.5–35.7)
MCV: 95 fL (ref 79–97)
Monocytes Absolute: 0.5 x10E3/uL (ref 0.1–0.9)
Monocytes: 10 %
Neutrophils Absolute: 2.4 x10E3/uL (ref 1.4–7.0)
Neutrophils: 53 %
Phosphorus: 2.8 mg/dL (ref 2.8–4.1)
Platelets: 296 x10E3/uL (ref 150–450)
Potassium: 4.2 mmol/L (ref 3.5–5.2)
Prostate Specific Ag, Serum: 0.4 ng/mL (ref 0.0–4.0)
RBC: 5.32 x10E6/uL (ref 4.14–5.80)
RDW: 12.9 % (ref 11.6–15.4)
Sodium: 136 mmol/L (ref 134–144)
T3 Uptake Ratio: 27 % (ref 24–39)
T4, Total: 8.2 ug/dL (ref 4.5–12.0)
TSH: 3.3 u[IU]/mL (ref 0.450–4.500)
Total Protein: 7.1 g/dL (ref 6.0–8.5)
Triglycerides: 154 mg/dL — ABNORMAL HIGH (ref 0–149)
Uric Acid: 7.8 mg/dL (ref 3.8–8.4)
VLDL Cholesterol Cal: 28 mg/dL (ref 5–40)
WBC: 4.6 x10E3/uL (ref 3.4–10.8)
eGFR: 108 mL/min/1.73 (ref >59)

## 2023-12-12 ENCOUNTER — Ambulatory Visit: Payer: Self-pay | Admitting: Physician Assistant

## 2023-12-12 ENCOUNTER — Encounter: Payer: Self-pay | Admitting: Physician Assistant

## 2023-12-12 VITALS — BP 123/83 | HR 67 | Resp 14 | Ht 69.0 in | Wt 209.0 lb

## 2023-12-12 DIAGNOSIS — Z0000 Encounter for general adult medical examination without abnormal findings: Secondary | ICD-10-CM

## 2023-12-12 MED ORDER — ROSUVASTATIN CALCIUM 40 MG PO TABS: 40.0000 mg | ORAL_TABLET | Freq: Every day | ORAL | 3 refills | Status: AC

## 2023-12-12 NOTE — Progress Notes (Signed)
 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

## 2023-12-12 NOTE — Progress Notes (Signed)
 [facility] ____________________________________________   None    (approximate)  I have reviewed the triage vital signs and the nursing notes.   HISTORY  Chief Complaint Employment Physical   HPI David Weber is a 48 y.o. 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  Patient voiced no concerns or complaints.         Past Medical History:  Diagnosis Date   DDD (degenerative disc disease), cervical    Elevated lipids    Elevated PSA    Neuropathy of hand, left    Overweight     Patient Active Problem List   Diagnosis Date Noted   Paresthesias in left hand 07/16/2019   BMI 31.0-31.9,adult 07/16/2019   Hyperlipidemia 10/03/2018    Past Surgical History:  Procedure Laterality Date   CHOLECYSTECTOMY  2007   HAND SURGERY  2008    Prior to Admission medications   Medication Sig Start Date End Date Taking? Authorizing Provider  amLODipine  (NORVASC ) 10 MG tablet Take 1 tablet (10 mg total) by mouth daily. 07/26/23   Claudene Tanda POUR, PA-C  phentermine  37.5 MG capsule Take 1 capsule (37.5 mg total) by mouth every morning. 11/22/23   Claudene Tanda POUR, PA-C  rosuvastatin  (CRESTOR ) 40 MG tablet Take 1 tablet (40 mg total) by mouth daily. 12/12/22   Claudene Tanda POUR, PA-C  sildenafil  (VIAGRA ) 100 MG tablet TAKE 1 TABLET BY MOUTH ONCE DAILY AS NEEDED FOR ERECTILE DYSFUNCTION 11/12/23   Claudene Tanda POUR, PA-C    Allergies Patient has no known allergies.  Family History  Problem Relation Age of Onset   Melanoma Mother    Colon polyps Mother    Diabetes Father    Colon polyps Father    Arrhythmia Father     Social History Social History   Tobacco Use   Smoking status: Never   Smokeless tobacco: Current    Types: Chew  Substance Use Topics   Alcohol use: Yes    Alcohol/week: 4.0 standard drinks of alcohol    Types: 4 Cans of beer per week   Drug use: Never    Review of  Systems  Constitutional: No fever/chills Eyes: No visual changes. ENT: No sore throat. Cardiovascular: Denies chest pain. Respiratory: Denies shortness of breath. Gastrointestinal: No abdominal pain.  No nausea, no vomiting.  No diarrhea.  No constipation. Genitourinary: Negative for dysuria. Musculoskeletal: Negative for back pain. Skin: Negative for rash. Neurological: Negative for headaches, focal weakness or numbness. Endocrine: Hyperlipidemia ____________________________________________   PHYSICAL EXAM:  VITAL SIGNS:  Constitutional: Alert and oriented. Well appearing and in no acute distress. Eyes: Conjunctivae are normal. PERRL. EOMI. BP 132/97  Cuff Size Large  Pulse Rate 82  Weight 209 lb (94.8 kg)  Height 5' 9 (1.753 m)  Resp 14  SpO2 97 %    Head: Atraumatic. Nose: No congestion/rhinnorhea. Mouth/Throat: Mucous membranes are moist.  Oropharynx non-erythematous. Neck: No stridor.  No cervical spine tenderness to palpation. Hematological/Lymphatic/Immunilogical: No cervical lymphadenopathy. Cardiovascular: Normal rate, regular rhythm. Grossly normal heart sounds.  Good peripheral circulation. Respiratory: Normal respiratory effort.  No retractions. Lungs CTAB. Gastrointestinal: Soft and nontender. No distention. No abdominal bruits. No CVA tenderness. Genitourinary: Deferred Musculoskeletal: No lower extremity tenderness nor edema.  No joint effusions. Neurologic:  Normal speech and language. No gross focal neurologic deficits are appreciated. No gait instability. Skin:  Skin is warm, dry and intact. No rash noted. Psychiatric: Mood and affect are  normal. Speech and behavior are normal.  ____________________________________________   LABS          Component Ref Range & Units (hover) 5 d ago 1 yr ago 2 yr ago 3 yr ago 4 yr ago  Color, UA yellow yellow amber Dark Yellow Yellow  Clarity, UA clear clear clear Clear Clear  Glucose, UA Negative Negative  Negative Negative Negative  Bilirubin, UA neg neg negative Negative Negative  Ketones, UA neg neg negative 1+ Negative  Spec Grav, UA 1.010 <=1.005 Abnormal  1.025 1.020 1.015  Blood, UA neg neg negative Negative Negative  pH, UA 7.0 6.0 6.0 6.0 6.0  Protein, UA Negative Negative Negative Negative Negative  Urobilinogen, UA negative Abnormal  0.2 0.2 0.2 0.2  Nitrite, UA neg neg negative Negative Negative  Leukocytes, UA Negative Negative Negative Negative Negative  Appearance   dark    Odor                    Component Ref Range & Units (hover) 5 d ago (12/07/23) 1 yr ago (12/07/22) 1 yr ago (12/21/21) 2 yr ago (01/10/21) 3 yr ago (12/30/19) 4 yr ago (02/26/19) 5 yr ago (09/30/18)  Glucose 81 94 84 102 High  CM 99 R 90 R   Uric Acid 7.8 8.0 CM 8.0 CM 8.1 CM 7.7 CM 7.3 R, CM   Comment:            Therapeutic target for gout patients: <6.0  BUN 10 9 10 8 10 10    Creatinine, Ser 0.87 0.78 0.98 0.93 0.93 0.94   eGFR 108 112 98 104     BUN/Creatinine Ratio 11 12 10 9 11 11    Sodium 136 137 138 139 137 139   Potassium 4.2 4.5 4.5 4.0 4.4 4.6   Chloride 98 99 102 102 102 103   Calcium  9.7 9.5 9.2 9.1 9.4 9.7   Phosphorus 2.8 2.7 Low  2.9 2.4 Low  2.5 Low  2.8   Total Protein 7.1 6.7 6.6 6.6 6.6 6.8   Albumin 4.7 4.7 4.6 4.6 R 4.6 R 4.9 R   Globulin, Total 2.4 2.0 2.0 2.0 2.0 1.9   Bilirubin Total 1.0 0.6 0.7 0.7 0.9 0.6   Alkaline Phosphatase 108 80 95 95 82 CM 86 R   Comment: **Effective December 17, 2023 Alkaline Phosphatase**   reference interval will be changing to:              Age                Male          Male           0 -  5 days         47 - 127       47 - 127           6 - 10 days         29 - 242       29 - 242          11 - 20 days        109 - 357      109 - 357          21 - 30 days         94 - 494       94 - 494           1 -  2 months      149 - 539      149 - 539           3 -  6 months      131 - 452      131 - 452           7 - 11 months      117 - 401       117 - 401   12 months -  6 years       158 - 369      158 - 369           7 - 12 years       150 - 409      150 - 409               13 years       156 - 435       78 - 227               14 years       114 - 375       64 - 161               15 years        88 - 279       56 - 134               16 years        74 - 207       51 - 121               17 years        63 - 161       47 - 113          18 - 20 years        51 - 125       42 - 106          21 - 50 years        47 - 123       41 - 116          51 - 80 years        49 - 135       51 - 125              >80 years        48 - 129       48 - 129  LDH 175 157 149 160 144 160   AST 23 32 17 20 18 21    ALT 33 56 High  24 35 29 35   GGT 34 61 24 39 30 37   Iron 187 High  132 109 125 142 111   Cholesterol, Total 243 High  264 High  188 229 High  232 High  231 High  220 High   Triglycerides 154 High  235 High  130 153 High  152 High  164 High  106  HDL 46 40 41 42 39 Low  46 43  VLDL Cholesterol Cal 28 45 High  23 28 28 30 21   LDL Chol Calc (NIH) 169 High  179 High  124 High  159 High  165 High  155 High    Chol/HDL Ratio 5.3 High  6.6 High  CM 4.6 CM 5.5 High  CM 5.9 High  CM 5.0 CM 5.1  High  CM  Comment:                                   T. Chol/HDL Ratio                                             Men  Women                               1/2 Avg.Risk  3.4    3.3                                   Avg.Risk  5.0    4.4                                2X Avg.Risk  9.6    7.1                                3X Avg.Risk 23.4   11.0  Estimated CHD Risk 1.1 High  1.4 High  CM 0.9 CM 1.1 High  CM 1.3 High  CM 1.0 CM   Comment: The CHD Risk is based on the T. Chol/HDL ratio. Other factors affect CHD Risk such as hypertension, smoking, diabetes, severe obesity, and family history of premature CHD.  TSH 3.300 2.550 2.980 2.480 4.150 3.880   T4, Total 8.2 7.7 7.1 7.8 6.6 8.0   T3 Uptake Ratio 27 26 29 27 22  Low  26   Free Thyroxine Index 2.2  2.0 2.1 2.1 1.5 2.1   Prostate Specific Ag, Serum 0.4 0.4 CM 0.4 CM 0.4 CM 0.4 CM 0.4 CM   Comment: Roche ECLIA methodology. According to the American U[employer] Association, Serum PSA should decrease and remain at undetectable levels after radical prostatectomy. The AUA defines biochemical recurrence as an initial PSA value 0.2 ng/mL or greater followed by a subsequent confirmatory PSA value 0.2 ng/mL or greater. Values obtained with different assay methods or kits cannot be used interchangeably. Results cannot be interpreted as absolute evidence of the presence or absence of malignant disease.  WBC 4.6 4.0 4.0 4.9 5.1 5.3   RBC 5.32 4.97 5.30 4.92 5.07 4.97   Hemoglobin 17.2 15.4 16.8 15.3 16.1 15.9   Hematocrit 50.4 45.1 48.9 44.9 46.5 44.6   MCV 95 91 92 91 92 90   MCH 32.3 31.0 31.7 31.1 31.8 32.0   MCHC 34.1 34.1 34.4 34.1 34.6 35.7   RDW 12.9 11.9 12.6 12.2 12.4 12.4   Platelets 296 263 285 261 281 287   Neutrophils 53 50 52 58 62 55   Lymphs 31 32 32 27 25 30    Monocytes 10 12 10 9 9 9    Eos 5 4 4 5 3 4    Basos 1 2 2 1 1 2    Neutrophils Absolute 2.4 2.0 2.1 2.9 3.1 2.9   Lymphocytes Absolute 1.4 1.3 1.3 1.3 1.3 1.6   Monocytes Absolute 0.5 0.5 0.4 0.4 0.5 0.5   EOS (ABSOLUTE) 0.2 0.2 0.2 0.2 0.1 0.2  Basophils Absolute 0.1 0.1 0.1 0.1 0.1 0.1   Immature Granulocytes 0 0 0 0 0 0   Immature Grans (Abs) 0.0 0.0 0.0 0.0 0.0 0.0                    ____________________________________________  EKG  Sinus rhythm at 70 bpm.  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  No change from previous EKG. ____________________________________________     ____________________________________________   INITIAL IMPRESSION / ASSESSMENT AND PLAN  As part of my medical decision making, I reviewed the following data within the electronic MEDICAL RECORD NUMBER      No acute findings on physical exam, EKG, labs.  Crestor  sent to pharmacy.  Follow-up as needed.         ____________________________________________   FINAL CLINICAL IMPRESSION(S) / ED DIAGNOSES  @[facility] @   ED Discharge Orders     None        Note:  This document was prepared using Dragon voice recognition software and may include unintentional dictation errors.

## 2023-12-21 ENCOUNTER — Other Ambulatory Visit: Payer: Self-pay | Admitting: Physician Assistant

## 2023-12-21 DIAGNOSIS — N528 Other male erectile dysfunction: Secondary | ICD-10-CM

## 2023-12-26 ENCOUNTER — Other Ambulatory Visit: Payer: Self-pay

## 2023-12-26 ENCOUNTER — Ambulatory Visit: Payer: Self-pay

## 2023-12-26 DIAGNOSIS — Z7689 Persons encountering health services in other specified circumstances: Secondary | ICD-10-CM

## 2023-12-26 MED ORDER — PHENTERMINE HCL 37.5 MG PO CAPS: 37.5000 mg | ORAL_CAPSULE | ORAL | 0 refills | Status: DC

## 2023-12-26 NOTE — Progress Notes (Signed)
 Weight check. Loss of 2lbs since 12-12-23

## 2024-01-28 ENCOUNTER — Ambulatory Visit: Payer: Self-pay

## 2024-01-28 ENCOUNTER — Other Ambulatory Visit: Payer: Self-pay

## 2024-01-28 DIAGNOSIS — Z7689 Persons encountering health services in other specified circumstances: Secondary | ICD-10-CM

## 2024-01-28 MED ORDER — PHENTERMINE HCL 37.5 MG PO CAPS: 37.5000 mg | ORAL_CAPSULE | ORAL | 0 refills | Status: DC

## 2024-01-28 NOTE — Progress Notes (Signed)
 Up 1 lb from last month but still down significantly since 07/2023.  Rx refill request sent to Renay Sharps, PA-C

## 2024-02-21 ENCOUNTER — Other Ambulatory Visit: Payer: Self-pay

## 2024-02-21 DIAGNOSIS — N528 Other male erectile dysfunction: Secondary | ICD-10-CM

## 2024-02-27 ENCOUNTER — Other Ambulatory Visit: Payer: Self-pay

## 2024-02-27 ENCOUNTER — Ambulatory Visit: Payer: Self-pay

## 2024-02-27 DIAGNOSIS — Z7689 Persons encountering health services in other specified circumstances: Secondary | ICD-10-CM

## 2024-02-27 MED ORDER — PHENTERMINE HCL 37.5 MG PO CAPS: 37.5000 mg | ORAL_CAPSULE | ORAL | 0 refills | Status: DC

## 2024-02-27 NOTE — Progress Notes (Signed)
 Weight in for maintenance of Phentermine  and tolerating well and needs another script.

## 2024-03-31 ENCOUNTER — Ambulatory Visit

## 2024-03-31 ENCOUNTER — Other Ambulatory Visit: Payer: Self-pay

## 2024-03-31 VITALS — BP 131/89 | HR 76 | Ht 69.0 in | Wt 206.4 lb

## 2024-03-31 DIAGNOSIS — Z7689 Persons encountering health services in other specified circumstances: Secondary | ICD-10-CM

## 2024-03-31 MED ORDER — AMLODIPINE BESYLATE 10 MG PO TABS: 10.0000 mg | ORAL_TABLET | Freq: Every day | ORAL | 3 refills | Status: AC

## 2024-03-31 MED ORDER — PHENTERMINE HCL 37.5 MG PO CAPS: 37.5000 mg | ORAL_CAPSULE | ORAL | 0 refills | Status: DC

## 2024-03-31 NOTE — Progress Notes (Signed)
 Pt presents today for weight check, previous weight 204lb. Today pt weighs in at 13

## 2024-05-07 ENCOUNTER — Ambulatory Visit: Payer: Self-pay

## 2024-05-07 ENCOUNTER — Other Ambulatory Visit: Payer: Self-pay

## 2024-05-07 VITALS — Ht 69.0 in | Wt 206.0 lb

## 2024-05-07 DIAGNOSIS — Z7689 Persons encountering health services in other specified circumstances: Secondary | ICD-10-CM

## 2024-05-07 MED ORDER — PHENTERMINE HCL 37.5 MG PO CAPS: 37.5000 mg | ORAL_CAPSULE | ORAL | 0 refills | Status: AC

## 2024-05-07 NOTE — Progress Notes (Signed)
 Pt presents today for weight check and rx refill. Pt weights in at 206. Previous weight is the same.
